# Patient Record
Sex: Male | Born: 1940 | Race: Black or African American | Hispanic: No | Marital: Married | Smoking: Current every day smoker
Health system: Southern US, Community
[De-identification: ages and names within clinical notes are randomized; demographics above are authoritative.]

## PROBLEM LIST (undated history)

## (undated) DIAGNOSIS — I739 Peripheral vascular disease, unspecified: Principal | ICD-10-CM

## (undated) DIAGNOSIS — M5090 Cervical disc disorder, unspecified, unspecified cervical region: Secondary | ICD-10-CM

## (undated) DIAGNOSIS — J449 Chronic obstructive pulmonary disease, unspecified: Secondary | ICD-10-CM

## (undated) DIAGNOSIS — N529 Male erectile dysfunction, unspecified: Secondary | ICD-10-CM

## (undated) DIAGNOSIS — F1910 Other psychoactive substance abuse, uncomplicated: Secondary | ICD-10-CM

## (undated) DIAGNOSIS — K279 Peptic ulcer, site unspecified, unspecified as acute or chronic, without hemorrhage or perforation: Secondary | ICD-10-CM

## (undated) DIAGNOSIS — M1990 Unspecified osteoarthritis, unspecified site: Secondary | ICD-10-CM

## (undated) DIAGNOSIS — E785 Hyperlipidemia, unspecified: Secondary | ICD-10-CM

## (undated) HISTORY — DX: Chronic obstructive pulmonary disease, unspecified: J44.9

## (undated) HISTORY — DX: Peptic ulcer, site unspecified, unspecified as acute or chronic, without hemorrhage or perforation: K27.9

## (undated) HISTORY — DX: Cervical disc disorder, unspecified, unspecified cervical region: M50.90

## (undated) HISTORY — DX: Other psychoactive substance abuse, uncomplicated: F19.10

## (undated) HISTORY — DX: Male erectile dysfunction, unspecified: N52.9

## (undated) HISTORY — DX: Hyperlipidemia, unspecified: E78.5

## (undated) HISTORY — DX: Unspecified osteoarthritis, unspecified site: M19.90

## (undated) HISTORY — PX: HARVEST BONE MARROW: SUR649

## (undated) HISTORY — DX: Peripheral vascular disease, unspecified: I73.9

## (undated) HISTORY — PX: ILIAC ARTERY STENT: SHX1786

---

## 1972-09-12 HISTORY — PX: STOMACH SURGERY: SHX791

## 1994-09-12 HISTORY — PX: CERVICAL DISC SURGERY: SHX588

## 1998-06-25 ENCOUNTER — Ambulatory Visit (HOSPITAL_COMMUNITY): Admission: RE | Admit: 1998-06-25 | Discharge: 1998-06-25 | Payer: Self-pay | Admitting: *Deleted

## 1998-08-10 ENCOUNTER — Ambulatory Visit (HOSPITAL_COMMUNITY): Admission: RE | Admit: 1998-08-10 | Discharge: 1998-08-10 | Payer: Self-pay | Admitting: *Deleted

## 2006-04-10 ENCOUNTER — Ambulatory Visit: Payer: Self-pay | Admitting: Internal Medicine

## 2006-05-01 ENCOUNTER — Ambulatory Visit: Payer: Self-pay | Admitting: Gastroenterology

## 2006-05-10 ENCOUNTER — Ambulatory Visit: Payer: Self-pay | Admitting: Gastroenterology

## 2006-05-10 ENCOUNTER — Encounter: Payer: Self-pay | Admitting: Gastroenterology

## 2006-05-10 LAB — HM COLONOSCOPY

## 2007-01-16 ENCOUNTER — Ambulatory Visit: Payer: Self-pay | Admitting: Internal Medicine

## 2007-01-16 DIAGNOSIS — R42 Dizziness and giddiness: Secondary | ICD-10-CM

## 2007-04-11 DIAGNOSIS — M5030 Other cervical disc degeneration, unspecified cervical region: Secondary | ICD-10-CM

## 2007-04-11 DIAGNOSIS — Z860100 Personal history of colon polyps, unspecified: Secondary | ICD-10-CM | POA: Insufficient documentation

## 2007-04-11 DIAGNOSIS — J449 Chronic obstructive pulmonary disease, unspecified: Secondary | ICD-10-CM

## 2007-04-11 DIAGNOSIS — K279 Peptic ulcer, site unspecified, unspecified as acute or chronic, without hemorrhage or perforation: Secondary | ICD-10-CM | POA: Insufficient documentation

## 2007-04-24 DIAGNOSIS — N529 Male erectile dysfunction, unspecified: Secondary | ICD-10-CM

## 2007-06-14 ENCOUNTER — Ambulatory Visit: Payer: Self-pay | Admitting: Internal Medicine

## 2007-06-14 LAB — CONVERTED CEMR LAB: Glucose, Bld: 99 mg/dL (ref 70–99)

## 2011-05-13 ENCOUNTER — Encounter: Payer: Self-pay | Admitting: Gastroenterology

## 2011-08-11 ENCOUNTER — Telehealth: Payer: Self-pay | Admitting: *Deleted

## 2011-08-11 NOTE — Telephone Encounter (Signed)
Pt calling to have colonoscopy scheduled

## 2011-08-11 NOTE — Telephone Encounter (Signed)
Please check with GI to see if he is due --he does have a history of polyps  I haven't seen him in quite a while, should set up physical with me as well (or Medicare wellness visit if he has regular Medicare)

## 2011-10-21 ENCOUNTER — Ambulatory Visit (AMBULATORY_SURGERY_CENTER): Payer: Medicare Other | Admitting: *Deleted

## 2011-10-21 VITALS — Ht 68.5 in | Wt 170.0 lb

## 2011-10-21 DIAGNOSIS — Z1211 Encounter for screening for malignant neoplasm of colon: Secondary | ICD-10-CM

## 2011-10-21 MED ORDER — PEG-KCL-NACL-NASULF-NA ASC-C 100 G PO SOLR: ORAL | Status: DC

## 2011-10-21 NOTE — Progress Notes (Signed)
Pt. States he doesn't read much at all.  Consent and info verbally read and explained to pt.

## 2011-11-04 ENCOUNTER — Other Ambulatory Visit: Payer: Self-pay | Admitting: Gastroenterology

## 2011-11-11 ENCOUNTER — Encounter: Payer: Self-pay | Admitting: Gastroenterology

## 2011-11-11 ENCOUNTER — Ambulatory Visit (AMBULATORY_SURGERY_CENTER): Payer: Medicare Other | Admitting: Gastroenterology

## 2011-11-11 VITALS — BP 168/99 | HR 81 | Temp 96.1°F | Resp 18 | Ht 68.0 in | Wt 170.0 lb

## 2011-11-11 DIAGNOSIS — Z86010 Personal history of colonic polyps: Secondary | ICD-10-CM

## 2011-11-11 DIAGNOSIS — Z1211 Encounter for screening for malignant neoplasm of colon: Secondary | ICD-10-CM | POA: Diagnosis not present

## 2011-11-11 DIAGNOSIS — K635 Polyp of colon: Secondary | ICD-10-CM

## 2011-11-11 DIAGNOSIS — D126 Benign neoplasm of colon, unspecified: Secondary | ICD-10-CM | POA: Diagnosis not present

## 2011-11-11 MED ORDER — SODIUM CHLORIDE 0.9 % IV SOLN: 500.0000 mL | INTRAVENOUS | Status: DC

## 2011-11-11 NOTE — Progress Notes (Signed)
Patient did not experience any of the following events: a burn prior to discharge; a fall within the facility; wrong site/side/patient/procedure/implant event; or a hospital transfer or hospital admission upon discharge from the facility. (G8907) Patient did not have preoperative order for IV antibiotic SSI prophylaxis. (G8918)  

## 2011-11-11 NOTE — Op Note (Signed)
[facility] [address] [place], [place]  [address]  COLONOSCOPY PROCEDURE REPORT  PATIENT:  [name], Sean  MR#:  [id number] BIRTHDATE:  1941/09/05, 70 yrs. old  GENDER:  male ENDOSCOPIST:  Rachael Fee, MD PROCEDURE DATE:  11/11/2011 PROCEDURE:  Colonoscopy with snare polypectomy ASA CLASS:  Class II INDICATIONS:  adenomatous polyp removed 2007 MEDICATIONS:   Fentanyl 50 mcg IV, These medications were titrated to patient response per physician's verbal order, Versed 5 mg IV  DESCRIPTION OF PROCEDURE:   After the risks benefits and alternatives of the procedure were thoroughly explained, informed consent was obtained.  Digital rectal exam was performed and revealed no rectal masses.   The LB [id number] endoscope was introduced through the anus and advanced to the cecum, which was identified by both the appendix and ileocecal valve, without limitations.  The quality of the prep was good..  The instrument was then slowly withdrawn as the colon was fully examined. <<PROCEDUREIMAGES>> FINDINGS:  Three small sessile polyps were found, removed with cold snare and all were sent to pathology. These ranged in size from 2mm to 4mm, located in transverse and sigmoid segments (see image3 and image4).  This was otherwise a normal examination of the colon (see image5, image2, and image1).   Retroflexed views in the rectum revealed no abnormalities. COMPLICATIONS:  None  ENDOSCOPIC IMPRESSION: 1) Three small polyps, all were removed and all were sent to pathology 2) Otherwise normal examination  RECOMMENDATIONS: 1) Given your personal history of adenomatous (pre-cancerous) polyps, you will need a repeat colonoscopy in 5 years even if the polyps removed today are not pre-cancerous.  ______________________________ Rachael Fee, MD  n. eSIGNED:   Rachael Fee at 11/11/2011 02:52 PM  Pamala Duffel, [phone]

## 2011-11-11 NOTE — Patient Instructions (Addendum)
One of your biggest health concerns is your smoking.  This increases your risk for most cancers and serious cardiovascular diseases such as strokes, heart attacks.  You should try your best to stop.  If you need assistance, please contact your PCP or Smoking Cessation Class at [facility] ([phone]) or [facility] (1-800-QUIT-NOW). Discharge instructions given with verbal understanding. Handout on polyps given. Resume previous medications. YOU HAD AN ENDOSCOPIC PROCEDURE TODAY AT THE [facility]: Refer to the procedure report that was given to you for any specific questions about what was found during the examination.  If the procedure report does not answer your questions, please call your gastroenterologist to clarify.  If you requested that your care partner not be given the details of your procedure findings, then the procedure report has been included in a sealed envelope for you to review at your convenience later.  YOU SHOULD EXPECT: Some feelings of bloating in the abdomen. Passage of more gas than usual.  Walking can help get rid of the air that was put into your GI tract during the procedure and reduce the bloating. If you had a lower endoscopy (such as a colonoscopy or flexible sigmoidoscopy) you may notice spotting of blood in your stool or on the toilet paper. If you underwent a bowel prep for your procedure, then you may not have a normal bowel movement for a few days.  DIET: Your first meal following the procedure should be a light meal and then it is ok to progress to your normal diet.  A half-sandwich or bowl of soup is an example of a good first meal.  Heavy or fried foods are harder to digest and may make you feel nauseous or bloated.  Likewise meals heavy in dairy and vegetables can cause extra gas to form and this can also increase the bloating.  Drink plenty of fluids but you should avoid alcoholic beverages for 24 hours.  ACTIVITY: Your care partner  should take you home directly after the procedure.  You should plan to take it easy, moving slowly for the rest of the day.  You can resume normal activity the day after the procedure however you should NOT DRIVE or use heavy machinery for 24 hours (because of the sedation medicines used during the test).    SYMPTOMS TO REPORT IMMEDIATELY: A gastroenterologist can be reached at any hour.  During normal business hours, 8:30 AM to 5:00 PM Monday through Friday, call [phone].  After hours and on weekends, please call the GI answering service at [phone] who will take a message and have the physician on call contact you.   Following lower endoscopy (colonoscopy or flexible sigmoidoscopy):  Excessive amounts of blood in the stool  Significant tenderness or worsening of abdominal pains  Swelling of the abdomen that is new, acute  Fever of 100F or higher  FOLLOW UP: If any biopsies were taken you will be contacted by phone or by letter within the next 1-3 weeks.  Call your gastroenterologist if you have not heard about the biopsies in 3 weeks.  Our staff will call the home number listed on your records the next business day following your procedure to check on you and address any questions or concerns that you may have at that time regarding the information given to you following your procedure. This is a courtesy call and so if there is no answer at the home number and we have not heard from you through the   emergency physician on call, we will assume that you have returned to your regular daily activities without incident.  SIGNATURES/CONFIDENTIALITY: You and/or your care partner have signed paperwork which will be entered into your electronic medical record.  These signatures attest to the fact that that the information above on your After Visit Summary has been reviewed and is understood.  Full responsibility of the confidentiality of this discharge information lies with you and/or your  care-partner. 

## 2011-11-14 ENCOUNTER — Telehealth: Payer: Self-pay

## 2011-11-14 NOTE — Telephone Encounter (Signed)
  Follow up Call-  Call back number 11/11/2011  Post procedure Call Back phone  # [phone]  Permission to leave phone message Yes     Patient questions:  Do you have a fever, pain , or abdominal swelling? no Pain Score  0 *  Have you tolerated food without any problems? yes  Have you been able to return to your normal activities? yes  Do you have any questions about your discharge instructions: Diet   no Medications  no Follow up visit  no  Do you have questions or concerns about your Care? no  Actions: * If pain score is 4 or above: No action needed, pain <4.

## 2011-11-17 ENCOUNTER — Encounter: Payer: Self-pay | Admitting: Gastroenterology

## 2011-11-28 ENCOUNTER — Encounter: Payer: Self-pay | Admitting: Internal Medicine

## 2011-11-28 ENCOUNTER — Ambulatory Visit (INDEPENDENT_AMBULATORY_CARE_PROVIDER_SITE_OTHER): Payer: Medicare Other | Admitting: Internal Medicine

## 2011-11-28 VITALS — BP 152/70 | HR 96 | Temp 98.0°F | Ht 68.0 in | Wt 172.0 lb

## 2011-11-28 DIAGNOSIS — I10 Essential (primary) hypertension: Secondary | ICD-10-CM | POA: Diagnosis not present

## 2011-11-28 DIAGNOSIS — Z0000 Encounter for general adult medical examination without abnormal findings: Secondary | ICD-10-CM

## 2011-11-28 DIAGNOSIS — R030 Elevated blood-pressure reading, without diagnosis of hypertension: Secondary | ICD-10-CM | POA: Insufficient documentation

## 2011-11-28 DIAGNOSIS — M159 Polyosteoarthritis, unspecified: Secondary | ICD-10-CM | POA: Insufficient documentation

## 2011-11-28 DIAGNOSIS — N529 Male erectile dysfunction, unspecified: Secondary | ICD-10-CM

## 2011-11-28 NOTE — Assessment & Plan Note (Signed)
I have personally reviewed the Medicare Annual Wellness questionnaire and have noted 1. The patient's medical and social history 2. Their use of alcohol, tobacco or illicit drugs 3. Their current medications and supplements 4. The patient's functional ability including ADL's, fall risks, home safety risks and hearing or visual             impairment. 5. Diet and physical activities 6. Evidence for depression or mood disorders  The patients weight, height, BMI and visual acuity have been recorded in the chart I have made referrals, counseling and provided education to the patient based review of the above and I have provided the pt with a written personalized care plan for preventive services.  I have provided you with a copy of your personalized plan for preventive services. Please take the time to review along with your updated medication list.  No PSA after discussion Doesn't want imms Not interested in stopping smoking

## 2011-11-28 NOTE — Progress Notes (Signed)
Subjective:    Patient ID: Sean [name], male    DOB: December 27, 1940, 71 y.o.   MRN: [id number]  HPI Here for Medicare Wellness exam Wife made him come here  No depression or anhedonia No falls or instability Follows some exercise programs on TV Totally retired now Reviewed advanced directives Did not want any vaccines Just had colonoscopy Doesn't want PSA done after discussion  Still has occ problems with neck pain Left arm will go numb at night at times No meds other than glucosamine chondroitin for general joint pain  Ongoing ED Tried cialis-- too expensive  Not interested in quitting smoking No sig cough  Breathing has been okay  Has right hip pain ---after walking a little ways No pain in feet Some knee pain as well  No current outpatient prescriptions on file prior to visit.    No Known Allergies  Past Medical History  Diagnosis Date  . Arthritis   . Substance abuse   . Ulcer   . COPD (chronic obstructive pulmonary disease)   . ED (erectile dysfunction)   . Cervical disc disease     Past Surgical History  Procedure Date  . Cervical disc surgery 1996  . Stomach surgery 1974    gastric ulcer    Family History  Problem Relation Age of Onset  . Arthritis Sister   . Hypertension Neg Hx   . Cancer Neg Hx     colon cancer    History   Social History  . Marital Status: Married    Spouse Name: N/A    Number of Children: 7  . Years of Education: N/A   Occupational History  . retired Music therapist     Social History Main Topics  . Smoking status: Current Everyday Smoker -- 0.8 packs/day    Types: Cigarettes  . Smokeless tobacco: Never Used  . Alcohol Use: Yes     rarely  . Drug Use: 7 per week    Special: Marijuana  . Sexually Active: Not on file   Other Topics Concern  . Not on file   Social History Narrative   Tries to do exercises regularly (TV shows)No living willRequests that wife make medical decisions for him if he is unableWould  accept CPR. Would accept mechanical ventilation. Would accept feeding tube   Review of Systems Sleeps well Appetite is stable Weight is stable No skin problems Bowels fine No trouble voiding---no nocturia. Does have mild urgency    Objective:   Physical Exam  Constitutional: He is oriented to person, place, and time. He appears well-developed and well-nourished. No distress.  Neck: Normal range of motion. Neck supple. No thyromegaly present.  Cardiovascular: Normal rate, regular rhythm and normal heart sounds.  Exam reveals no gallop.   No murmur heard.      Very faint pedal pulses  Pulmonary/Chest: Effort normal and breath sounds normal. No respiratory distress. He has no wheezes. He has no rales.  Abdominal: Soft. There is no tenderness.  Musculoskeletal: He exhibits no edema and no tenderness.       Slight decrease in internal rotation of right hip  Lymphadenopathy:    He has no cervical adenopathy.  Neurological: He is alert and oriented to person, place, and time.       President-- "Obama, Bush, Clinton, Bush" [id number]-w Recall  3/3  Psychiatric: He has a normal mood and affect. His behavior is normal. Judgment and thought content normal.  Assessment & Plan:

## 2011-11-28 NOTE — Assessment & Plan Note (Signed)
cilais works but too expensive

## 2011-11-28 NOTE — Assessment & Plan Note (Signed)
Mostly right hip and knees Considered vascular etiology for hip pain---not likely. If his symptoms worsen, will consider checking ABIs though Can try tylenol

## 2011-11-28 NOTE — Patient Instructions (Signed)
Please call for further testing if you have more trouble walking with hip or leg pain You can try acetaminophen 650mg  up to three times a day for joint pains

## 2011-11-28 NOTE — Assessment & Plan Note (Signed)
BP Readings from Last 3 Encounters:  11/28/11 152/70  11/11/11 168/99  06/14/07 138/78   Probably white coat component Wouldn't take meds anyway Will just monitor

## 2011-11-29 LAB — BASIC METABOLIC PANEL
BUN: 11 mg/dL (ref 6–23)
CO2: 27 mEq/L (ref 19–32)
Chloride: 102 mEq/L (ref 96–112)
GFR: 83.5 mL/min (ref >60.00)
Glucose, Bld: 88 mg/dL (ref 70–99)
Potassium: 4.2 mEq/L (ref 3.5–5.1)
Sodium: 139 mEq/L (ref 135–145)

## 2011-11-29 LAB — LDL CHOLESTEROL, DIRECT: Direct LDL: 159.5 mg/dL

## 2011-11-29 LAB — CBC WITH DIFFERENTIAL/PLATELET
Basophils Absolute: 0.1 10*3/uL (ref 0.0–0.1)
Eosinophils Relative: 2.6 % (ref 0.0–5.0)
HCT: 40.6 % (ref 39.0–52.0)
Hemoglobin: 13 g/dL (ref 13.0–17.0)
Lymphs Abs: 1.7 10*3/uL (ref 0.7–4.0)
MCV: 85.2 fl (ref 78.0–100.0)
Monocytes Absolute: 0.5 10*3/uL (ref 0.1–1.0)
Monocytes Relative: 7.2 % (ref 3.0–12.0)
Neutro Abs: 4.2 10*3/uL (ref 1.4–7.7)
RDW: 15.4 % — ABNORMAL HIGH (ref 11.5–14.6)

## 2011-11-29 LAB — LIPID PANEL: HDL: 37 mg/dL — ABNORMAL LOW (ref >39.00)

## 2011-11-29 LAB — HEPATIC FUNCTION PANEL
ALT: 14 U/L (ref 0–53)
AST: 20 U/L (ref 0–37)
Albumin: 4 g/dL (ref 3.5–5.2)

## 2011-11-30 ENCOUNTER — Encounter: Payer: Self-pay | Admitting: *Deleted

## 2013-01-14 ENCOUNTER — Ambulatory Visit: Payer: Medicare Other | Admitting: Internal Medicine

## 2013-01-16 ENCOUNTER — Ambulatory Visit (INDEPENDENT_AMBULATORY_CARE_PROVIDER_SITE_OTHER): Payer: Medicare Other | Admitting: Internal Medicine

## 2013-01-16 ENCOUNTER — Encounter: Payer: Self-pay | Admitting: Internal Medicine

## 2013-01-16 VITALS — BP 148/80 | HR 88 | Temp 97.8°F | Wt 169.0 lb

## 2013-01-16 DIAGNOSIS — Z125 Encounter for screening for malignant neoplasm of prostate: Secondary | ICD-10-CM | POA: Diagnosis not present

## 2013-01-16 DIAGNOSIS — J449 Chronic obstructive pulmonary disease, unspecified: Secondary | ICD-10-CM

## 2013-01-16 DIAGNOSIS — R030 Elevated blood-pressure reading, without diagnosis of hypertension: Secondary | ICD-10-CM

## 2013-01-16 DIAGNOSIS — E785 Hyperlipidemia, unspecified: Secondary | ICD-10-CM | POA: Diagnosis not present

## 2013-01-16 DIAGNOSIS — J4489 Other specified chronic obstructive pulmonary disease: Secondary | ICD-10-CM

## 2013-01-16 DIAGNOSIS — R0602 Shortness of breath: Secondary | ICD-10-CM

## 2013-01-16 DIAGNOSIS — I739 Peripheral vascular disease, unspecified: Secondary | ICD-10-CM

## 2013-01-16 LAB — CBC WITH DIFFERENTIAL/PLATELET
Basophils Absolute: 0.1 10*3/uL (ref 0.0–0.1)
Eosinophils Absolute: 0.2 10*3/uL (ref 0.0–0.7)
Lymphocytes Relative: 33.5 % (ref 12.0–46.0)
MCHC: 33 g/dL (ref 30.0–36.0)
Monocytes Relative: 8.8 % (ref 3.0–12.0)
Neutrophils Relative %: 54.3 % (ref 43.0–77.0)
RDW: 17.3 % — ABNORMAL HIGH (ref 11.5–14.6)

## 2013-01-16 LAB — BASIC METABOLIC PANEL
CO2: 26 mEq/L (ref 19–32)
Calcium: 9.3 mg/dL (ref 8.4–10.5)
Creatinine, Ser: 1.4 mg/dL (ref 0.4–1.5)
GFR: 64.33 mL/min (ref >60.00)
Glucose, Bld: 106 mg/dL — ABNORMAL HIGH (ref 70–99)

## 2013-01-16 LAB — LIPID PANEL: Cholesterol: 261 mg/dL — ABNORMAL HIGH (ref 0–200)

## 2013-01-16 LAB — HEPATIC FUNCTION PANEL
Alkaline Phosphatase: 91 U/L (ref 39–117)
Bilirubin, Direct: 0 mg/dL (ref 0.0–0.3)
Total Bilirubin: 0.7 mg/dL (ref 0.3–1.2)
Total Protein: 7.6 g/dL (ref 6.0–8.3)

## 2013-01-16 LAB — TSH: TSH: 1.09 u[IU]/mL (ref 0.35–5.50)

## 2013-01-16 NOTE — Assessment & Plan Note (Signed)
Unwilling to stop cigarettes Symptoms don't really warrant Rx now (he doesn't want anything)

## 2013-01-16 NOTE — Assessment & Plan Note (Signed)
BP Readings from Last 3 Encounters:  01/16/13 148/80  11/28/11 152/70  11/11/11 168/99   With new guidelines, and since he won't take meds, will just observe

## 2013-01-16 NOTE — Assessment & Plan Note (Signed)
Has relatively stable DOE Doesn't cough or wheeze Likely related to COPD----he is not willing to stop smoking but discussed cutting down Could have CAD but no specific indications of this

## 2013-01-16 NOTE — Assessment & Plan Note (Signed)
If vascular disease confirmed, should at least offer statin

## 2013-01-16 NOTE — Assessment & Plan Note (Signed)
Hip pain seems to be claudication, not from hip Will check ABIs

## 2013-01-16 NOTE — Progress Notes (Signed)
  Subjective:    Patient ID: Sean [name], male    DOB: [birth year], 72 y.o.   MRN: [id number]  HPI Here with wife---only here due to her concerns  Ongoing hip pain---right Has to sit regularly when he tries to walk Glucosamine/chondroitin and joint juice----not particularly helpful No OTC analgesics Has tried elliptical at home--- does 5-10 minutes at a time  Gets SOB easily too Has to rest quickly when doing work Some nighttime cough---dry No wheezing No chest pain No palpitations  Checked BP at [facility] 189/something That prompted the visit  Current Outpatient Prescriptions on File Prior to Visit  Medication Sig Dispense Refill  . glucosamine-chondroitin 500-400 MG tablet Take 1 tablet by mouth daily.       No current facility-administered medications on file prior to visit.    No Known Allergies  Past Medical History  Diagnosis Date  . Arthritis   . Substance abuse   . Ulcer   . COPD (chronic obstructive pulmonary disease)   . ED (erectile dysfunction)   . Cervical disc disease     Past Surgical History  Procedure Laterality Date  . Cervical disc surgery  1996  . Stomach surgery  1974    gastric ulcer    Family History  Problem Relation Age of Onset  . Arthritis Sister   . Hypertension Neg Hx   . Cancer Neg Hx     colon cancer    History   Social History  . Marital Status: Married    Spouse Name: N/A    Number of Children: 7  . Years of Education: N/A   Occupational History  . retired Music therapist     Social History Main Topics  . Smoking status: Current Every Day Smoker -- 0.80 packs/day    Types: Cigarettes  . Smokeless tobacco: Never Used  . Alcohol Use: Yes     Comment: rarely  . Drug Use: 7.00 per week    Special: Marijuana  . Sexually Active: Not on file   Other Topics Concern  . Not on file   Social History Narrative   Tries to do exercises regularly (TV shows)   No living will   Requests that wife make medical decisions  for him if he is unable   Would accept CPR. Would accept mechanical ventilation. Would accept feeding tube   Review of Systems Appetite is fine Sleeps well No mood problems but tends to want to just stay home Weight is down 3#    Objective:   Physical Exam  Constitutional: He appears well-developed and well-nourished. No distress.  Neck: Normal range of motion. Neck supple. No thyromegaly present.  Cardiovascular: Normal rate, regular rhythm and normal heart sounds.  Exam reveals no gallop.   No murmur heard. No pedal pulses  Pulmonary/Chest:  Slightly decreased breath sounds but clear  Abdominal: Soft. He exhibits no mass. There is no tenderness.  Musculoskeletal: He exhibits no edema and no tenderness.  Normal internal and external rotation of both hips  Lymphadenopathy:    He has no cervical adenopathy.  Psychiatric: He has a normal mood and affect. His behavior is normal.          Assessment & Plan:

## 2013-01-17 ENCOUNTER — Encounter: Payer: Self-pay | Admitting: *Deleted

## 2013-01-18 ENCOUNTER — Encounter (INDEPENDENT_AMBULATORY_CARE_PROVIDER_SITE_OTHER): Payer: Medicare Other

## 2013-01-18 ENCOUNTER — Telehealth: Payer: Self-pay | Admitting: *Deleted

## 2013-01-18 DIAGNOSIS — I739 Peripheral vascular disease, unspecified: Secondary | ICD-10-CM

## 2013-01-18 DIAGNOSIS — I70219 Atherosclerosis of native arteries of extremities with intermittent claudication, unspecified extremity: Secondary | ICD-10-CM | POA: Diagnosis not present

## 2013-01-18 NOTE — Telephone Encounter (Signed)
CALL REPORT!  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 Per Sean [name], they will schedule appt with peripheral vascular consult for 01/29/13 @ 9am with Dr. Kirke Corin, they wanted to make the appt today because if not he would have to wait longer. If pt or Dr.Letvak would like to change the appt please call the office.  ( the report should be read by Monday)

## 2013-01-18 NOTE — Telephone Encounter (Signed)
This has been going on for quite some time I think the appt later this month should be fine

## 2013-01-29 ENCOUNTER — Ambulatory Visit: Payer: Medicare Other | Admitting: Cardiovascular Disease

## 2013-02-05 ENCOUNTER — Encounter: Payer: Self-pay | Admitting: Cardiology

## 2013-02-05 ENCOUNTER — Encounter: Payer: Self-pay | Admitting: Cardiovascular Disease

## 2013-02-05 ENCOUNTER — Ambulatory Visit (INDEPENDENT_AMBULATORY_CARE_PROVIDER_SITE_OTHER): Payer: Medicare Other | Admitting: Cardiovascular Disease

## 2013-02-05 VITALS — BP 183/79 | HR 80 | Wt 171.0 lb

## 2013-02-05 DIAGNOSIS — F17200 Nicotine dependence, unspecified, uncomplicated: Secondary | ICD-10-CM

## 2013-02-05 DIAGNOSIS — F17210 Nicotine dependence, cigarettes, uncomplicated: Secondary | ICD-10-CM | POA: Insufficient documentation

## 2013-02-05 DIAGNOSIS — R0602 Shortness of breath: Secondary | ICD-10-CM | POA: Diagnosis not present

## 2013-02-05 DIAGNOSIS — Z01812 Encounter for preprocedural laboratory examination: Secondary | ICD-10-CM

## 2013-02-05 DIAGNOSIS — I739 Peripheral vascular disease, unspecified: Secondary | ICD-10-CM

## 2013-02-05 DIAGNOSIS — Z720 Tobacco use: Secondary | ICD-10-CM

## 2013-02-05 DIAGNOSIS — I10 Essential (primary) hypertension: Secondary | ICD-10-CM

## 2013-02-05 DIAGNOSIS — E785 Hyperlipidemia, unspecified: Secondary | ICD-10-CM | POA: Diagnosis not present

## 2013-02-05 HISTORY — DX: Peripheral vascular disease, unspecified: I73.9

## 2013-02-05 LAB — BASIC METABOLIC PANEL
BUN: 13 mg/dL (ref 6–23)
CO2: 27 mEq/L (ref 19–32)
Chloride: 102 mEq/L (ref 96–112)
GFR: 73.31 mL/min (ref >60.00)
Glucose, Bld: 98 mg/dL (ref 70–99)
Potassium: 4.1 mEq/L (ref 3.5–5.1)
Sodium: 136 mEq/L (ref 135–145)

## 2013-02-05 LAB — CBC WITH DIFFERENTIAL/PLATELET
Basophils Absolute: 0.1 10*3/uL (ref 0.0–0.1)
HCT: 40.6 % (ref 39.0–52.0)
Hemoglobin: 13.4 g/dL (ref 13.0–17.0)
Lymphs Abs: 2.2 10*3/uL (ref 0.7–4.0)
MCV: 81.7 fl (ref 78.0–100.0)
Monocytes Absolute: 0.6 10*3/uL (ref 0.1–1.0)
Monocytes Relative: 8.6 % (ref 3.0–12.0)
Neutro Abs: 4.2 10*3/uL (ref 1.4–7.7)
Platelets: 282 10*3/uL (ref 150.0–400.0)
RDW: 16.7 % — ABNORMAL HIGH (ref 11.5–14.6)

## 2013-02-05 MED ORDER — ATORVASTATIN CALCIUM 20 MG PO TABS: 20.0000 mg | ORAL_TABLET | Freq: Every day | ORAL | Status: DC

## 2013-02-05 MED ORDER — AMLODIPINE BESYLATE 5 MG PO TABS: 5.0000 mg | ORAL_TABLET | Freq: Every day | ORAL | Status: DC

## 2013-02-05 NOTE — Progress Notes (Signed)
HPI  This is a 72 year old African American male who was referred by Dr. Alphonsus Sias for evaluation and management of peripheral arterial disease. He has prolonged history of tobacco use but no known coronary artery disease. He also has hypertension and hyperlipidemia. He has been having right hip and buttock discomfort with walking for more than a year and thought it was due to arthritis. He gets significant discomfort after about walking on the 75-100 yards which forces him to stop for about 3 minutes before he can resume. He has cut down on his activities due to that. He has exertional dyspnea without reported chest pain.  No Known Allergies   Current Outpatient Prescriptions on File Prior to Visit  Medication Sig Dispense Refill  . glucosamine-chondroitin 500-400 MG tablet Take 1 tablet by mouth daily.       No current facility-administered medications on file prior to visit.     Past Medical History  Diagnosis Date  . Arthritis   . Substance abuse   . Ulcer   . COPD (chronic obstructive pulmonary disease)   . ED (erectile dysfunction)   . Cervical disc disease   . Hyperlipidemia      Past Surgical History  Procedure Laterality Date  . Cervical disc surgery  1996  . Stomach surgery  1974    gastric ulcer     Family History  Problem Relation Age of Onset  . Arthritis Sister   . Hypertension Neg Hx   . Cancer Neg Hx     colon cancer     History   Social History  . Marital Status: Married    Spouse Name: N/A    Number of Children: 7  . Years of Education: N/A   Occupational History  . retired Music therapist     Social History Main Topics  . Smoking status: Current Every Day Smoker -- 0.80 packs/day    Types: Cigarettes  . Smokeless tobacco: Never Used  . Alcohol Use: Yes     Comment: rarely  . Drug Use: 7.00 per week    Special: Marijuana  . Sexually Active: Not on file   Other Topics Concern  . Not on file   Social History Narrative   Tries to do  exercises regularly (TV shows)   No living will   Requests that wife make medical decisions for him if he is unable   Would accept CPR. Would accept mechanical ventilation. Would accept feeding tube     ROS Constitutional: Negative for fever, chills, diaphoresis, activity change, appetite change and fatigue.  HENT: Negative for hearing loss, nosebleeds, congestion, sore throat, facial swelling, drooling, trouble swallowing, neck pain, voice change, sinus pressure and tinnitus.  Eyes: Negative for photophobia, pain, discharge and visual disturbance.  Respiratory: Negative for apnea, cough, chest tightness and wheezing.  Cardiovascular: Negative for chest pain, palpitations and leg swelling.  Gastrointestinal: Negative for nausea, vomiting, abdominal pain, diarrhea, constipation, blood in stool and abdominal distention.  Genitourinary: Negative for dysuria, urgency, frequency, hematuria and decreased urine volume.  Skin: Negative for color change, pallor, rash and wound.  Neurological: Negative for dizziness, tremors, seizures, syncope, speech difficulty, weakness, light-headedness, numbness and headaches.  Psychiatric/Behavioral: Negative for suicidal ideas, hallucinations, behavioral problems and agitation. The patient is not nervous/anxious.     PHYSICAL EXAM   BP 183/79  Pulse 80  Wt 171 lb (77.565 kg)  BMI 26.01 kg/m2 Constitutional: He is oriented to person, place, and time. He appears well-developed and well-nourished. No  distress.  HENT: No nasal discharge.  Head: Normocephalic and atraumatic.  Eyes: Pupils are equal and round. Right eye exhibits no discharge. Left eye exhibits no discharge.  Neck: Normal range of motion. Neck supple. No JVD present. No thyromegaly present.  Cardiovascular: Normal rate, regular rhythm, normal heart sounds and. Exam reveals no gallop and no friction rub. No murmur heard.  Pulmonary/Chest: Effort normal and breath sounds normal. No stridor. No  respiratory distress. He has no wheezes. He has no rales. He exhibits no tenderness.  Abdominal: Soft. Bowel sounds are normal. He exhibits no distension. There is no tenderness. There is no rebound and no guarding.  Musculoskeletal: Normal range of motion. He exhibits no edema and no tenderness.  Neurological: He is alert and oriented to person, place, and time. Coordination normal.  Skin: Skin is warm and dry. No rash noted. He is not diaphoretic. No erythema. No pallor.  Psychiatric: He has a normal mood and affect. His behavior is normal. Judgment and thought content normal.  Vascular: Radial pulses normal bilaterally. Femoral pulses are diminished on the right side and slightly diminished on the left side. Distal pulses are not palpable.     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   ASSESSMENT AND PLAN

## 2013-02-05 NOTE — Assessment & Plan Note (Signed)
I had a prolonged discussion with him about the importance of smoking cessation. 

## 2013-02-05 NOTE — Addendum Note (Signed)
Addended by: Lorine Bears A on: 02/05/2013 10:03 AM   Modules accepted: Orders

## 2013-02-05 NOTE — Assessment & Plan Note (Signed)
The patient has severe lifestyle limiting claudication affecting the right hip and buttock area. ABI was moderately reduced bilaterally and worse on the right side at 0.42 on the right and 0.5 on the left. There was evidence of severe stenosis involving the right external iliac artery which is likely the culprit. The left SFA was also noted to be occluded proximally with reconstitution in the midsegment. He is not having claudication on the left side likely due to developing symptoms of the right side before the threshold for left leg claudication. Given the severity of his symptoms, I recommend proceeding with abdominal aortogram, lower extremity runoff and possible angioplasty.

## 2013-02-05 NOTE — Assessment & Plan Note (Signed)
Due to his vascular disease, I recommend that also we treat this. I will start amlodipine 5 mg once daily to make sure his blood pressure is reasonably controlled before the procedure.

## 2013-02-05 NOTE — Assessment & Plan Note (Signed)
It is possible that this is due to COPD and prolonged history of smoking. However, he has significant risk factors for coronary artery disease with an abnormal baseline EKG. He will need  some form of stress testing at some point.

## 2013-02-05 NOTE — Assessment & Plan Note (Signed)
Lab Results  Component Value Date   CHOL 261* 01/16/2013   HDL 32.60* 01/16/2013   LDLDIRECT 181.8 01/16/2013   TRIG 189.0* 01/16/2013   CHOLHDL 8 01/16/2013   Given that he now has established atherosclerosis, I recommend aggressive treatment of his hyperlipidemia. Due to that, I will go ahead and start atorvastatin 20 mg daily.

## 2013-02-05 NOTE — Patient Instructions (Addendum)
Your physician recommends that you have lab today: BMET, CBC, PTINR  Your physician has recommended you make the following change in your medication:   1. Start Amlodipine 5 mg Daily 2. Start Atorvastatin 20 mg at Bedtime  Your physician has recommended that you have a Abdominal Aortogram with LE run-off on 02/13/2013 @ 9:30 am. After this is performed, and if a blockage is found, an angioplasty or stenting procedure may be necessary.    Hypertension Hypertension is another name for high blood pressure. High blood pressure may mean that your heart needs to work harder to pump blood. Blood pressure consists of two numbers, which includes a higher number over a lower number (example: 110/72). HOME CARE   Make lifestyle changes as told by your doctor. This may include weight loss and exercise.  Take your blood pressure medicine every day.  Limit how much salt you use.  Stop smoking if you smoke.  Do not use drugs.  Talk to your doctor if you are using decongestants or birth control pills. These medicines might make blood pressure higher.  Females should not drink more than 1 alcoholic drink per day. Males should not drink more than 2 alcoholic drinks per day.  See your doctor as told. GET HELP RIGHT AWAY IF:   You have a blood pressure reading with a top number of 180 or higher.  You get a very bad headache.  You get blurred or changing vision.  You feel confused.  You feel weak, numb, or faint.  You get chest or belly (abdominal) pain.  You throw up (vomit).  You cannot breathe very well. MAKE SURE YOU:   Understand these instructions.  Will watch your condition.  Will get help right away if you are not doing well or get worse. Document Released: 02/15/2008 Document Revised: 11/21/2011 Document Reviewed: 02/15/2008 [facility] Patient Information 2014 [place], [place].  Cholesterol Cholesterol is a type of fat. Your body needs a small amount of cholesterol, but too  much can cause health problems. Certain problems include heart attacks, strokes, and not enough blood flow to your heart, brain, kidneys, or feet. You get cholesterol in 2 ways:  Naturally.  By eating certain foods. HOME CARE  Eat a low-fat diet:  Eat less eggs, whole dairy products (whole milk, cheese, and butter), fatty meats, and fried foods.  Eat more fruits, vegetables, whole-wheat breads, lean chicken, and fish.  Follow your exercise program as told by your doctor.  Keep your weight at a healthy level. Talk to your doctor about what is right for you.  Only take medicine as told by your doctor.  Get your cholesterol checked once a year or as told by your doctor. MAKE SURE YOU:  Understand these instructions.  Will watch your condition.  Will get help right away if you are not doing well or get worse. Document Released: 11/25/2008 Document Revised: 11/21/2011 Document Reviewed: 11/25/2008 [facility] Patient Information 2014 [place], [place].

## 2013-02-06 ENCOUNTER — Encounter (HOSPITAL_COMMUNITY): Payer: Self-pay | Admitting: Pharmacy Technician

## 2013-02-13 ENCOUNTER — Encounter (HOSPITAL_COMMUNITY)
Admission: RE | Disposition: A | Discharge status: Home / Self Care | Payer: Self-pay | Source: Ambulatory Visit | Attending: Cardiovascular Disease

## 2013-02-13 ENCOUNTER — Ambulatory Visit (HOSPITAL_COMMUNITY)
Admission: RE | Admit: 2013-02-13 | Discharge: 2013-02-13 | Disposition: A | Discharge status: Home / Self Care | Payer: Medicare Other | Source: Ambulatory Visit | Attending: Cardiovascular Disease | Admitting: Cardiovascular Disease

## 2013-02-13 ENCOUNTER — Other Ambulatory Visit: Payer: Self-pay | Admitting: Cardiovascular Disease

## 2013-02-13 ENCOUNTER — Other Ambulatory Visit: Payer: Self-pay

## 2013-02-13 DIAGNOSIS — F17200 Nicotine dependence, unspecified, uncomplicated: Secondary | ICD-10-CM | POA: Insufficient documentation

## 2013-02-13 DIAGNOSIS — I739 Peripheral vascular disease, unspecified: Secondary | ICD-10-CM

## 2013-02-13 DIAGNOSIS — J4489 Other specified chronic obstructive pulmonary disease: Secondary | ICD-10-CM | POA: Insufficient documentation

## 2013-02-13 DIAGNOSIS — E785 Hyperlipidemia, unspecified: Secondary | ICD-10-CM | POA: Insufficient documentation

## 2013-02-13 DIAGNOSIS — I708 Atherosclerosis of other arteries: Secondary | ICD-10-CM | POA: Insufficient documentation

## 2013-02-13 DIAGNOSIS — I4460 Unspecified fascicular block: Secondary | ICD-10-CM | POA: Insufficient documentation

## 2013-02-13 DIAGNOSIS — M5090 Cervical disc disorder, unspecified, unspecified cervical region: Secondary | ICD-10-CM | POA: Insufficient documentation

## 2013-02-13 DIAGNOSIS — F1210 Cannabis abuse, uncomplicated: Secondary | ICD-10-CM | POA: Insufficient documentation

## 2013-02-13 DIAGNOSIS — N529 Male erectile dysfunction, unspecified: Secondary | ICD-10-CM | POA: Insufficient documentation

## 2013-02-13 DIAGNOSIS — I70219 Atherosclerosis of native arteries of extremities with intermittent claudication, unspecified extremity: Secondary | ICD-10-CM | POA: Insufficient documentation

## 2013-02-13 DIAGNOSIS — J449 Chronic obstructive pulmonary disease, unspecified: Secondary | ICD-10-CM | POA: Insufficient documentation

## 2013-02-13 DIAGNOSIS — M129 Arthropathy, unspecified: Secondary | ICD-10-CM | POA: Insufficient documentation

## 2013-02-13 HISTORY — PX: PERCUTANEOUS STENT INTERVENTION: SHX5500

## 2013-02-13 HISTORY — PX: LOWER EXTREMITY ANGIOGRAM: SHX5508

## 2013-02-13 HISTORY — PX: ABDOMINAL AORTAGRAM: SHX5454

## 2013-02-13 SURGERY — Surgical Case
Anesthesia: *Unknown

## 2013-02-13 SURGERY — ABDOMINAL AORTAGRAM
Anesthesia: LOCAL | Laterality: Right

## 2013-02-13 MED ORDER — ASPIRIN 81 MG PO CHEW
324.0000 mg | CHEWABLE_TABLET | ORAL | Status: AC
  Administered 2013-02-13: 324 mg via ORAL
  Filled 2013-02-13: qty 4

## 2013-02-13 MED ORDER — LABETALOL HCL 5 MG/ML IV SOLN
10.0000 mg | Freq: Once | INTRAVENOUS | Status: AC
  Administered 2013-02-13: 10 mg via INTRAVENOUS
  Filled 2013-02-13: qty 4

## 2013-02-13 MED ORDER — MIDAZOLAM HCL 2 MG/2ML IJ SOLN
INTRAMUSCULAR | Status: AC
  Filled 2013-02-13: qty 2

## 2013-02-13 MED ORDER — SODIUM CHLORIDE 0.9 % IV SOLN: INTRAVENOUS | Status: AC

## 2013-02-13 MED ORDER — CLOPIDOGREL BISULFATE 75 MG PO TABS: 75.0000 mg | ORAL_TABLET | Freq: Every day | ORAL | Status: DC

## 2013-02-13 MED ORDER — SODIUM CHLORIDE 0.9 % IV SOLN
250.0000 mL | INTRAVENOUS | Status: DC | PRN
Start: 2013-02-13 — End: 2013-02-13

## 2013-02-13 MED ORDER — SODIUM CHLORIDE 0.9 % IV SOLN: INTRAVENOUS | Status: DC

## 2013-02-13 MED ORDER — SODIUM CHLORIDE 0.9 % IJ SOLN: 3.0000 mL | Freq: Two times a day (BID) | INTRAMUSCULAR | Status: DC

## 2013-02-13 MED ORDER — LABETALOL HCL 5 MG/ML IV SOLN: 10.0000 mg | Freq: Once | INTRAVENOUS | Status: DC

## 2013-02-13 MED ORDER — HEPARIN (PORCINE) IN NACL 2-0.9 UNIT/ML-% IJ SOLN
INTRAMUSCULAR | Status: AC
  Filled 2013-02-13: qty 1000

## 2013-02-13 MED ORDER — CLOPIDOGREL BISULFATE 300 MG PO TABS
ORAL_TABLET | ORAL | Status: AC
  Filled 2013-02-13: qty 2

## 2013-02-13 MED ORDER — SODIUM CHLORIDE 0.9 % IV SOLN
INTRAVENOUS | Status: DC
  Administered 2013-02-13: 100 mL/h via INTRAVENOUS

## 2013-02-13 MED ORDER — LIDOCAINE HCL (PF) 1 % IJ SOLN
INTRAMUSCULAR | Status: AC
  Filled 2013-02-13: qty 30

## 2013-02-13 MED ORDER — FENTANYL CITRATE 0.05 MG/ML IJ SOLN
INTRAMUSCULAR | Status: AC
  Filled 2013-02-13: qty 2

## 2013-02-13 MED ORDER — SODIUM CHLORIDE 0.9 % IJ SOLN: 3.0000 mL | INTRAMUSCULAR | Status: DC | PRN

## 2013-02-13 NOTE — Interval H&P Note (Signed)
History and Physical Interval Note:  02/13/2013 10:10 AM  Sean [name]  has presented today for surgery, with the diagnosis of pad  The various methods of treatment have been discussed with the patient and family. After consideration of risks, benefits and other options for treatment, the patient has consented to  Procedure(s): ABDOMINAL AORTAGRAM (N/A) as a surgical intervention .  The patient's history has been reviewed, patient examined, no change in status, stable for surgery.  I have reviewed the patient's chart and labs.  Questions were answered to the patient's satisfaction.     Lorine Bears

## 2013-02-13 NOTE — H&P (View-Only) (Signed)
  HPI  This is a 71-year-old African American male who was referred by Dr. Letvak for evaluation and management of peripheral arterial disease. He has prolonged history of tobacco use but no known coronary artery disease. He also has hypertension and hyperlipidemia. He has been having right hip and buttock discomfort with walking for more than a year and thought it was due to arthritis. He gets significant discomfort after about walking on the 75-100 yards which forces him to stop for about 3 minutes before he can resume. He has cut down on his activities due to that. He has exertional dyspnea without reported chest pain.  No Known Allergies   Current Outpatient Prescriptions on File Prior to Visit  Medication Sig Dispense Refill  . glucosamine-chondroitin 500-400 MG tablet Take 1 tablet by mouth daily.       No current facility-administered medications on file prior to visit.     Past Medical History  Diagnosis Date  . Arthritis   . Substance abuse   . Ulcer   . COPD (chronic obstructive pulmonary disease)   . ED (erectile dysfunction)   . Cervical disc disease   . Hyperlipidemia      Past Surgical History  Procedure Laterality Date  . Cervical disc surgery  1996  . Stomach surgery  1974    gastric ulcer     Family History  Problem Relation Age of Onset  . Arthritis Sister   . Hypertension Neg Hx   . Cancer Neg Hx     colon cancer     History   Social History  . Marital Status: Married    Spouse Name: N/A    Number of Children: 7  . Years of Education: N/A   Occupational History  . retired carpenter     Social History Main Topics  . Smoking status: Current Every Day Smoker -- 0.80 packs/day    Types: Cigarettes  . Smokeless tobacco: Never Used  . Alcohol Use: Yes     Comment: rarely  . Drug Use: 7.00 per week    Special: Marijuana  . Sexually Active: Not on file   Other Topics Concern  . Not on file   Social History Narrative   Tries to do  exercises regularly (TV shows)   No living will   Requests that wife make medical decisions for him if he is unable   Would accept CPR. Would accept mechanical ventilation. Would accept feeding tube     ROS Constitutional: Negative for fever, chills, diaphoresis, activity change, appetite change and fatigue.  HENT: Negative for hearing loss, nosebleeds, congestion, sore throat, facial swelling, drooling, trouble swallowing, neck pain, voice change, sinus pressure and tinnitus.  Eyes: Negative for photophobia, pain, discharge and visual disturbance.  Respiratory: Negative for apnea, cough, chest tightness and wheezing.  Cardiovascular: Negative for chest pain, palpitations and leg swelling.  Gastrointestinal: Negative for nausea, vomiting, abdominal pain, diarrhea, constipation, blood in stool and abdominal distention.  Genitourinary: Negative for dysuria, urgency, frequency, hematuria and decreased urine volume.  Skin: Negative for color change, pallor, rash and wound.  Neurological: Negative for dizziness, tremors, seizures, syncope, speech difficulty, weakness, light-headedness, numbness and headaches.  Psychiatric/Behavioral: Negative for suicidal ideas, hallucinations, behavioral problems and agitation. The patient is not nervous/anxious.     PHYSICAL EXAM   BP 183/79  Pulse 80  Wt 171 lb (77.565 kg)  BMI 26.01 kg/m2 Constitutional: He is oriented to person, place, and time. He appears well-developed and well-nourished. No   distress.  HENT: No nasal discharge.  Head: Normocephalic and atraumatic.  Eyes: Pupils are equal and round. Right eye exhibits no discharge. Left eye exhibits no discharge.  Neck: Normal range of motion. Neck supple. No JVD present. No thyromegaly present.  Cardiovascular: Normal rate, regular rhythm, normal heart sounds and. Exam reveals no gallop and no friction rub. No murmur heard.  Pulmonary/Chest: Effort normal and breath sounds normal. No stridor. No  respiratory distress. He has no wheezes. He has no rales. He exhibits no tenderness.  Abdominal: Soft. Bowel sounds are normal. He exhibits no distension. There is no tenderness. There is no rebound and no guarding.  Musculoskeletal: Normal range of motion. He exhibits no edema and no tenderness.  Neurological: He is alert and oriented to person, place, and time. Coordination normal.  Skin: Skin is warm and dry. No rash noted. He is not diaphoretic. No erythema. No pallor.  Psychiatric: He has a normal mood and affect. His behavior is normal. Judgment and thought content normal.  Vascular: Radial pulses normal bilaterally. Femoral pulses are diminished on the right side and slightly diminished on the left side. Distal pulses are not palpable.     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   ASSESSMENT AND PLAN   

## 2013-02-13 NOTE — CV Procedure (Signed)
PERIPHERAL VASCULAR PROCEDURE  NAME:  Sean [name]   MRN: [id number] DOB:  [birth year]   ADMIT DATE: 02/13/2013  Performing Cardiologist: Lorine Bears Primary Physician: Tillman Abide, MD  Procedures Performed:  Abdominal Aortic Angiogram with Bi-Iliofemoral Runoff  Bilateral Lower Extremity Angiography (1st Order)  Self-expanding stent placement to the right external iliac artery.  Mynx closure device.   Indication(s):   Claudication  Critical Limb Ischemia   Consent: The procedure with Risks/Benefits/Alternatives and Indications was reviewed with the patient .  All questions were answered.  Medications:  Sedation:  1 mg IV Versed, 25 mcg IV Fentanyl  Contrast:  145 mL  Visipaque   Procedural details: The right groin was prepped, draped, and anesthetized with 1% lidocaine. Using modified Seldinger technique, a 5 French sheath was introduced into the right common femoral artery. A 5 Fr Short Pigtail Catheter was advanced of over a  Versicore wire into the descending Aorta to a level just above the renal arteries. A power injection of 42ml/sec contrast over 1 sec was performed for Abdominal Aortic Angiography.  The catheter was then pulled back to a level just above the Aortic bifurcation, and a second power injection was performed to evaluate the iliac arteries.  Due to subtotal occlusion of the right external iliac artery, the intervention was performed before completing the runoff.  Bilateral lower extremity arterial run off was  performed via power injection of 7 ml / sec contrast for a total of 77 ml.    Interventional Procedure:  The sheath was exchanged into a 7 French Bright tip sheath. 4000 units of unfractionated heparin was given. 600 mg of Plavix was given close of the end of the case. The lesion was crossed with the versicolor wire. I then used a 6 cm balloon for measurement but decided not to predilate the lesion as it was noted to be a soft plaque  with risk of distal embolization. I decided to direct stent this with a 10 x 60 mm Smart self-expanding stent which was positioned carefully. The internal iliac artery had to be jailed by the stent as the plaque was extending all the way to the ostium. The stent was post dilated with an 8 x 60 mm balloon to 8 atmospheres. Angiography showed excellent results with normal flow in the internal iliac artery. The sheath was exchanged into a short 7 [place] sheath. The site was closed with a Mynx closure device.  Hemodynamics:  Central Aortic Pressure / Mean Aortic Pressure: 157/57  Findings:  Abdominal aorta: Normal in size with no evidence of aneurysm. There is mild diffuse atherosclerosis.  Left renal artery: Normal  Right renal artery: Normal  Celiac artery: Patent  Superior mesenteric artery: Patent  Right common iliac artery: Normal in size with minor irregularities.  Right internal iliac artery: Normal in size with diffuse 50-60% disease in the midsegment.  Right external iliac artery: Subtotally occluded with soft plaque.  Right common femoral artery: Minor irregularities.  Right profunda femoral artery: Normal  Right superficial femoral artery: Diffusely diseased proximally (50%) with a short occlusion in the midsegment. The SFA gets collaterals from the profunda.  Right popliteal artery: Normal  Three-vessel runoff below the knee but not well visualized due to motion.  Left common iliac artery:  30% ostial stenosis.  Left internal iliac artery: 90% ostial stenosis.  Left external iliac artery: Diffuse 50% disease.  Left common femoral artery: Minor irregularities.  Left profunda femoral artery: Normal  Left superficial  femoral artery:  Occluded proximally with reconstitution distally via collaterals from the profunda.  Left popliteal artery: Minor irregularities.  1 vessel runoff below the knee with a dominant vessel being the anterior tibial. The peroneal and  posterior tibial are diffusely diseased proximally and appears to be occluded in the midsegment.  Conclusions: 1. 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 2. Long occlusion of the left SFA with 1 vessel runoff below the knee. 3. Diffusely diseased SFA on the right side with short occlusion in the midsegment. Three-vessel runoff below the knee. 4. Successful stent placement to the right external iliac artery.  Recommendations:  Aggressive medical therapy is recommended. Plavix for at least one month. Smoking cessation is strongly advised. The SFA will likely be treated medically.   Lorine Bears, MD, [facility] 02/13/2013 11:26 AM

## 2013-02-13 NOTE — Progress Notes (Signed)
Pts BP much lower at 140/84.  Dr. Kirke Corin notified per order.  States ok for pt to go home.

## 2013-02-13 NOTE — Progress Notes (Signed)
Pts BP elevated. See vs.  Dr. Kirke Corin notified.  Orders received for Labetolol. Given

## 2013-02-15 ENCOUNTER — Encounter: Payer: Self-pay | Admitting: Cardiovascular Disease

## 2013-02-15 ENCOUNTER — Ambulatory Visit (INDEPENDENT_AMBULATORY_CARE_PROVIDER_SITE_OTHER): Payer: Medicare Other | Admitting: Cardiovascular Disease

## 2013-02-15 ENCOUNTER — Telehealth: Payer: Self-pay | Admitting: *Deleted

## 2013-02-15 VITALS — BP 152/74 | HR 82 | Ht 68.5 in | Wt 165.2 lb

## 2013-02-15 DIAGNOSIS — R0602 Shortness of breath: Secondary | ICD-10-CM | POA: Diagnosis not present

## 2013-02-15 DIAGNOSIS — I10 Essential (primary) hypertension: Secondary | ICD-10-CM

## 2013-02-15 DIAGNOSIS — E785 Hyperlipidemia, unspecified: Secondary | ICD-10-CM | POA: Diagnosis not present

## 2013-02-15 DIAGNOSIS — I739 Peripheral vascular disease, unspecified: Secondary | ICD-10-CM

## 2013-02-15 DIAGNOSIS — Z720 Tobacco use: Secondary | ICD-10-CM

## 2013-02-15 DIAGNOSIS — F17200 Nicotine dependence, unspecified, uncomplicated: Secondary | ICD-10-CM

## 2013-02-15 MED ORDER — TRAMADOL HCL 50 MG PO TABS: 50.0000 mg | ORAL_TABLET | Freq: Four times a day (QID) | ORAL | Status: DC | PRN

## 2013-02-15 NOTE — Telephone Encounter (Signed)
I asked his wife to bring him now for a check. This patient does not usually complain much.

## 2013-02-15 NOTE — Assessment & Plan Note (Signed)
The patient will require stress testing in the near future. There is a high chance of underlying obstructive coronary artery disease.

## 2013-02-15 NOTE — Progress Notes (Signed)
HPI  This is a 72 year old African American male who was  added to my schedule today due to discomfort that the right groin cath site.He has prolonged history of tobacco use but no known coronary artery disease. He also has hypertension and hyperlipidemia. He was seen recently for right hip and buttock discomfort with walking 75-100 yards. His ABI was significantly abnormal bilaterally. I performed angiography on Wednesday which showed subtotal occlusion of the right external iliac artery, long occlusion of the left SFA with 1 vessel runoff below the knee, diffusely diseased right SFA with three-vessel runoff below the knee. I performed self-expanding stent placement to the right external iliac artery without complications. The site   was closed with a Mynx device. His blood pressure was running high after the procedure he was given IV labetalol and hydralazine. He was discharged on the same day. We called him today to schedule a followup appointment and he complained of right groin pain at the cath site. Thus, he was brought for evaluation. The pain is only localized at the cath site and does not radiate down his right leg. The leg is warm. There is no swelling or bleeding. No Known Allergies   Current Outpatient Prescriptions on File Prior to Visit  Medication Sig Dispense Refill  . amLODipine (NORVASC) 5 MG tablet Take 1 tablet (5 mg total) by mouth daily.  30 tablet  6  . aspirin 81 MG tablet Take 81 mg by mouth daily.      Marland Kitchen atorvastatin (LIPITOR) 20 MG tablet Take 1 tablet (20 mg total) by mouth at bedtime.  30 tablet  6  . clopidogrel (PLAVIX) 75 MG tablet Take 1 tablet (75 mg total) by mouth daily.  90 tablet  6  . glucosamine-chondroitin 500-400 MG tablet Take 1 tablet by mouth daily.       No current facility-administered medications on file prior to visit.     Past Medical History  Diagnosis Date  . Arthritis   . Substance abuse   . Ulcer   . COPD (chronic obstructive  pulmonary disease)   . ED (erectile dysfunction)   . Cervical disc disease   . Hyperlipidemia   . PAD (peripheral artery disease) 02/05/2013    05/14: Subtotally occluded right external iliac artery, long occlusion of the left proximal to mid SFA with 1 vessel runoff below the knee, diffusely diseased right SFA with short occlusion in the midsegment with three-vessel runoff below the knee. Status post self-expanding stent placement to the right external iliac artery.     Past Surgical History  Procedure Laterality Date  . Cervical disc surgery  1996  . Stomach surgery  1974    gastric ulcer  . Iliac artery stent      right stent placement     Family History  Problem Relation Age of Onset  . Arthritis Sister   . Hypertension Neg Hx   . Cancer Neg Hx     colon cancer     History   Social History  . Marital Status: Married    Spouse Name: N/A    Number of Children: 7  . Years of Education: N/A   Occupational History  . retired Music therapist     Social History Main Topics  . Smoking status: Current Every Day Smoker -- 0.25 packs/day    Types: Cigarettes  . Smokeless tobacco: Never Used  . Alcohol Use: Yes     Comment: rarely  . Drug Use: 7.00 per  week    Special: Marijuana  . Sexually Active: Not on file   Other Topics Concern  . Not on file   Social History Narrative   Tries to do exercises regularly (TV shows)   No living will   Requests that wife make medical decisions for him if he is unable   Would accept CPR. Would accept mechanical ventilation. Would accept feeding tube      PHYSICAL EXAM   BP 152/74  Pulse 82  Ht 5' 8.5" (1.74 m)  Wt 165 lb 4 oz (74.957 kg)  BMI 24.76 kg/m2 Constitutional: He is oriented to person, place, and time. He appears well-developed and well-nourished. No distress.  HENT: No nasal discharge.  Head: Normocephalic and atraumatic.  Eyes: Pupils are equal and round. Right eye exhibits no discharge. Left eye exhibits no  discharge.  Neck: Normal range of motion. Neck supple. No JVD present. No thyromegaly present.  Cardiovascular: Normal rate, regular rhythm, normal heart sounds and. Exam reveals no gallop and no friction rub. No murmur heard.  Pulmonary/Chest: Effort normal and breath sounds normal. No stridor. No respiratory distress. He has no wheezes. He has no rales. He exhibits no tenderness.  Abdominal: Soft. Bowel sounds are normal. He exhibits no distension. There is no tenderness. There is no rebound and no guarding.  Musculoskeletal: Normal range of motion. He exhibits no edema and no tenderness.  Neurological: He is alert and oriented to person, place, and time. Coordination normal.  Skin: Skin is warm and dry. No rash noted. He is not diaphoretic. No erythema. No pallor.  Psychiatric: He has a normal mood and affect. His behavior is normal. Judgment and thought content normal.  Vascular: Radial pulses normal bilaterally. Femoral pulses are diminished on the right side and slightly diminished on the left side. Distal pulses are not palpable. The right groin is with no hematoma. There is a very small scar tissue with tenderness. There is no bruit.     EKG: Normal sinus rhythm with latrial enlargement and left anterior fascicular block.   ASSESSMENT AND PLAN

## 2013-02-15 NOTE — Telephone Encounter (Signed)
Wife states pt is very tender at groin area where catheter was inserted Says pt is waling "bent over" because it hurts to stand up straight Denies visible/palpable hematoma at site Confirms both feet warm to touch C/o pain in right LE when walking No numbness/tingling Denies lower back pain/pain elsewhere that may indicate retroperitoneal bleed I reassured wife some tenderness at site of cath can be expected but I will make Dr. Kirke Corin aware and call her back Understanding verb

## 2013-02-15 NOTE — Assessment & Plan Note (Addendum)
I started him on atorvastatin. He will need a followup lipid and liver profile in the near future.

## 2013-02-15 NOTE — Assessment & Plan Note (Signed)
The patient had successful stent placement to the right external iliac artery for subtotal occlusion. He is complaining of discomfort at the cath site. By Physical Exam, there is no evidence of hematoma or bruit. The tenderness seems to be localized. The leg is warm. I will schedule him for Doppler evaluation to make sure he does not have a small pseudoaneurysm especially that his blood pressure was running high after the procedure. I gave him, tramadol 50 mg every 6 hours as needed. Would obtain an ABI at the same time with Doppler. Continue treatment with aspirin and Plavix. I plan on treating the SFA disease medically for the time being.

## 2013-02-15 NOTE — Patient Instructions (Addendum)
Start Tramadol 50 mg every 6 hours as needed for pain.  Schedule ABI and Groin ultrasound next week.  Follow up in 3 weeks.

## 2013-02-15 NOTE — Assessment & Plan Note (Signed)
He was counseled extensively about the importance of smoking cessation after his procedure. I will need to discuss with him again upon followup.

## 2013-02-15 NOTE — Telephone Encounter (Signed)
Called patient to schedule his abi's and f/u visit with Dr. Kirke Corin, spoke with the wife who says patient is having a lot of pain in the leg area when the stent was placed and she wanted to know is this normal after this type procedure. Would like RN to call her to discuss.

## 2013-02-15 NOTE — Assessment & Plan Note (Signed)
His blood pressure improved after the addition of amlodipine. He will likely require another medication.

## 2013-02-20 ENCOUNTER — Telehealth: Payer: Self-pay | Admitting: Cardiovascular Disease

## 2013-02-20 ENCOUNTER — Encounter (INDEPENDENT_AMBULATORY_CARE_PROVIDER_SITE_OTHER): Payer: Medicare Other

## 2013-02-20 DIAGNOSIS — R0989 Other specified symptoms and signs involving the circulatory and respiratory systems: Secondary | ICD-10-CM | POA: Diagnosis not present

## 2013-02-20 DIAGNOSIS — I739 Peripheral vascular disease, unspecified: Secondary | ICD-10-CM | POA: Diagnosis not present

## 2013-02-20 DIAGNOSIS — M79609 Pain in unspecified limb: Secondary | ICD-10-CM

## 2013-02-20 NOTE — Telephone Encounter (Signed)
Patient needs a refill on his Rx: pain medication ?, patient is unsure of the name

## 2013-02-20 NOTE — Telephone Encounter (Signed)
I discussed with Dr. Kirke Corin, who asks that I check with [facility] u/s tech re: results from today Per Sean [name] in echo LE art duplex was WNL and ABI on Right improved from 0.40's to 0.70's  Dr. Kirke Corin made aware. He asks that I call pt to see if he is completely out and reassure pt test were WNL  I explained this to pt who says he has 15 tablets left but is having to take med q6hours for the RLE pain and would like a refill "in case I run out and need this medication". I reassured pt of normal test results. I had him hold while I informed Dr. Kirke Corin.  Per Dr. Kirke Corin, pt should wait until he is completely out of medication then can call us if still in pain, otherwise pain is coming from another source.  Pt informed Understanding verb He will call us when out of tramadol and if still in pain at that time

## 2013-02-20 NOTE — Telephone Encounter (Signed)
Do you want me to refill this???

## 2013-03-12 ENCOUNTER — Encounter: Payer: Self-pay | Admitting: Cardiovascular Disease

## 2013-03-12 ENCOUNTER — Ambulatory Visit (INDEPENDENT_AMBULATORY_CARE_PROVIDER_SITE_OTHER): Payer: Medicare Other | Admitting: Cardiovascular Disease

## 2013-03-12 VITALS — BP 173/79 | HR 72 | Ht 68.0 in | Wt 167.2 lb

## 2013-03-12 DIAGNOSIS — R0989 Other specified symptoms and signs involving the circulatory and respiratory systems: Secondary | ICD-10-CM

## 2013-03-12 DIAGNOSIS — E785 Hyperlipidemia, unspecified: Secondary | ICD-10-CM | POA: Diagnosis not present

## 2013-03-12 DIAGNOSIS — I10 Essential (primary) hypertension: Secondary | ICD-10-CM

## 2013-03-12 DIAGNOSIS — R0600 Dyspnea, unspecified: Secondary | ICD-10-CM

## 2013-03-12 DIAGNOSIS — I739 Peripheral vascular disease, unspecified: Secondary | ICD-10-CM

## 2013-03-12 DIAGNOSIS — R0602 Shortness of breath: Secondary | ICD-10-CM

## 2013-03-12 DIAGNOSIS — Z720 Tobacco use: Secondary | ICD-10-CM

## 2013-03-12 DIAGNOSIS — R0609 Other forms of dyspnea: Secondary | ICD-10-CM | POA: Diagnosis not present

## 2013-03-12 DIAGNOSIS — F17200 Nicotine dependence, unspecified, uncomplicated: Secondary | ICD-10-CM

## 2013-03-12 MED ORDER — HYDROCHLOROTHIAZIDE 25 MG PO TABS: 25.0000 mg | ORAL_TABLET | Freq: Every day | ORAL | Status: DC

## 2013-03-12 NOTE — Progress Notes (Signed)
HPI  This is a 72 year old [place] American male who is here today for followup visit.  He has prolonged history of tobacco use but no known coronary artery disease. He also has hypertension and hyperlipidemia. He was seen recently for right hip and buttock discomfort with walking 75-100 yards. His ABI was significantly abnormal bilaterally. 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 I performed self-expanding stent placement to the right external iliac artery without complications. He was seen shortly few days after the procedure due to discomfort at the groin site. He underwent duplex ultrasound which showed no evidence of pseudoaneurysm. The discomfort improved with short-term tramadol. He reports resolution of right calf claudication. He denies symptoms on the left side. He continues to complain of exertional dyspnea without chest pain. His blood pressure is still elevated.   No Known Allergies   Current Outpatient Prescriptions on File Prior to Visit  Medication Sig Dispense Refill  . amLODipine (NORVASC) 5 MG tablet Take 1 tablet (5 mg total) by mouth daily.  30 tablet  6  . aspirin 81 MG tablet Take 81 mg by mouth daily.      Marland Kitchen atorvastatin (LIPITOR) 20 MG tablet Take 1 tablet (20 mg total) by mouth at bedtime.  30 tablet  6  . clopidogrel (PLAVIX) 75 MG tablet Take 1 tablet (75 mg total) by mouth daily.  90 tablet  6  . glucosamine-chondroitin 500-400 MG tablet Take 1 tablet by mouth daily.       No current facility-administered medications on file prior to visit.     Past Medical History  Diagnosis Date  . Arthritis   . Substance abuse   . Ulcer   . COPD (chronic obstructive pulmonary disease)   . ED (erectile dysfunction)   . Cervical disc disease   . Hyperlipidemia   . PAD (peripheral  artery disease) 02/05/2013    05/14: Subtotally occluded right external iliac artery, long occlusion of the left proximal to mid SFA with 1 vessel runoff below the knee, diffusely diseased right SFA with short occlusion in the midsegment with three-vessel runoff below the knee. Status post self-expanding stent placement to the right external iliac artery.     Past Surgical History  Procedure Laterality Date  . Cervical disc surgery  1996  . Stomach surgery  1974    gastric ulcer  . Iliac artery stent      right stent placement     Family History  Problem Relation Age of Onset  . Arthritis Sister   . Hypertension Neg Hx   . Cancer Neg Hx     colon cancer     History   Social History  . Marital Status: Married    Spouse Name: N/A    Number of Children: 7  . Years of Education: N/A   Occupational History  . retired Music therapist     Social History Main Topics  . Smoking status: Current Every Day Smoker -- 0.25 packs/day for 54 years    Types: Cigarettes  . Smokeless tobacco: Never Used  . Alcohol Use: Yes     Comment: rarely  . Drug Use: 7.00 per week    Special: Marijuana  . Sexually Active: Not on file   Other Topics Concern  . Not on file   Social History Narrative  Tries to do exercises regularly (TV shows)   No living will   Requests that wife make medical decisions for him if he is unable   Would accept CPR. Would accept mechanical ventilation. Would accept feeding tube      PHYSICAL EXAM   BP 173/79  Pulse 72  Ht 5\' 8"  (1.727 m)  Wt 167 lb 4 oz (75.864 kg)  BMI 25.44 kg/m2 Constitutional: He is oriented to person, place, and time. He appears well-developed and well-nourished. No distress.  HENT: No nasal discharge.  Head: Normocephalic and atraumatic.  Eyes: Pupils are equal and round. Right eye exhibits no discharge. Left eye exhibits no discharge.  Neck: Normal range of motion. Neck supple. No JVD present. No thyromegaly present.    Cardiovascular: Normal rate, regular rhythm, normal heart sounds and. Exam reveals no gallop and no friction rub. No murmur heard.  Pulmonary/Chest: Effort normal and breath sounds normal. No stridor. No respiratory distress. He has no wheezes. He has no rales. He exhibits no tenderness.  Abdominal: Soft. Bowel sounds are normal. He exhibits no distension. There is no tenderness. There is no rebound and no guarding.  Musculoskeletal: Normal range of motion. He exhibits no edema and no tenderness.  Neurological: He is alert and oriented to person, place, and time. Coordination normal.  Skin: Skin is warm and dry. No rash noted. He is not diaphoretic. No erythema. No pallor.  Psychiatric: He has a normal mood and affect. His behavior is normal. Judgment and thought content normal.  Vascular: Radial pulses normal bilaterally. Femoral pulses are diminished on the right side and slightly diminished on the left side. Distal pulses are not palpable. The right groin is with no hematoma. Femoral pulses are normal bilaterally. 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   ASSESSMENT AND PLAN

## 2013-03-12 NOTE — Assessment & Plan Note (Signed)
The patient had complete resolution of right leg claudication after recent stent placement to the right external iliac artery. His ABI improved from 0.42 to 0.74. ABI in the left side was 0.68. I plan on treating his SFA disease medically. Continue dual antiplatelet therapy likely long-term.

## 2013-03-12 NOTE — Assessment & Plan Note (Signed)
I had a prolonged discussion with him about the importance of smoking cessation. 

## 2013-03-12 NOTE — Assessment & Plan Note (Signed)
His blood pressure is still elevated. I will add hydrochlorothiazide 25 mg once daily. Check basic metabolic profile in one week.

## 2013-03-12 NOTE — Assessment & Plan Note (Signed)
The patient continues to have significant exertional dyspnea without chest pain. His baseline ECG is abnormal with possible old inferior infarct. He has multiple risk factors for coronary artery disease and thus I recommend evaluation with a treadmill nuclear stress test. A treadmill stress test alone would not be sufficient due to presence of left anterior fascicular block on his EKG.

## 2013-03-12 NOTE — Assessment & Plan Note (Signed)
Continue treatment with atorvastatin. I will request a followup lipid and liver profile.

## 2013-03-12 NOTE — Patient Instructions (Addendum)
Fasting labs in 1 week from now.   Start Hydrochlorothiazide 25 mg once daily for high blood pressure.  Continue other medications.   Your physician has requested that you have en exercise stress myoview. For further information please visit [email or web address] Please follow instruction sheet, as given.  Follow up after tests.

## 2013-03-14 ENCOUNTER — Ambulatory Visit: Payer: Self-pay | Admitting: Cardiovascular Disease

## 2013-03-14 DIAGNOSIS — R609 Edema, unspecified: Secondary | ICD-10-CM | POA: Diagnosis not present

## 2013-03-14 DIAGNOSIS — R030 Elevated blood-pressure reading, without diagnosis of hypertension: Secondary | ICD-10-CM | POA: Diagnosis not present

## 2013-03-18 ENCOUNTER — Other Ambulatory Visit: Payer: Self-pay

## 2013-03-18 DIAGNOSIS — R0600 Dyspnea, unspecified: Secondary | ICD-10-CM

## 2013-03-19 ENCOUNTER — Ambulatory Visit (INDEPENDENT_AMBULATORY_CARE_PROVIDER_SITE_OTHER): Payer: Medicare Other

## 2013-03-19 DIAGNOSIS — I10 Essential (primary) hypertension: Secondary | ICD-10-CM

## 2013-03-20 LAB — BASIC METABOLIC PANEL
GFR calc Af Amer: 70 mL/min/{1.73_m2} (ref >59)
GFR calc non Af Amer: 60 mL/min/{1.73_m2} (ref >59)
Potassium: 4.7 mmol/L (ref 3.5–5.2)
Sodium: 140 mmol/L (ref 134–144)

## 2013-04-01 ENCOUNTER — Encounter: Payer: Self-pay | Admitting: Cardiovascular Disease

## 2013-04-01 ENCOUNTER — Ambulatory Visit (INDEPENDENT_AMBULATORY_CARE_PROVIDER_SITE_OTHER): Payer: Medicare Other | Admitting: Cardiovascular Disease

## 2013-04-01 VITALS — BP 130/70 | HR 89 | Ht 68.5 in | Wt 167.0 lb

## 2013-04-01 DIAGNOSIS — I739 Peripheral vascular disease, unspecified: Secondary | ICD-10-CM | POA: Diagnosis not present

## 2013-04-01 DIAGNOSIS — I2510 Atherosclerotic heart disease of native coronary artery without angina pectoris: Secondary | ICD-10-CM

## 2013-04-01 DIAGNOSIS — E785 Hyperlipidemia, unspecified: Secondary | ICD-10-CM | POA: Diagnosis not present

## 2013-04-01 DIAGNOSIS — I10 Essential (primary) hypertension: Secondary | ICD-10-CM

## 2013-04-01 NOTE — Assessment & Plan Note (Signed)
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 Ejection fraction was 52%. Based on this, he likely has underlying coronary artery disease. His symptoms have improved with medical therapy and thus, there is no urgent need to proceed with cardiac catheterization. He is on good medical therapy at this point.

## 2013-04-01 NOTE — Assessment & Plan Note (Signed)
Continue treatment with atorvastatin. Check fasting lipid and liver profile today. 

## 2013-04-01 NOTE — Progress Notes (Signed)
HPI  This is a 72 year old African American male who is here today for followup visit regarding peripheral arterial disease and slightly abnormal stress test..  He has prolonged history of tobacco , hypertension and hyperlipidemia. He was seen in may of 2014 for right hip and buttock discomfort with walking 75-100 yards. His ABI was significantly abnormal bilaterally. 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 I performed self-expanding stent placement to the right external iliac artery without complications.  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 Ejection fraction was 52%. Over all slightly abnormal stress test but low risk. His blood pressure was running high and I added hydrochlorothiazide 25 mg once daily. He is overall feeling better. He denies lower extremity claudication.   No Known Allergies   Current Outpatient Prescriptions on File Prior to Visit  Medication Sig Dispense Refill  . amLODipine (NORVASC) 5 MG tablet Take 1 tablet (5 mg total) by mouth daily.  30 tablet  6  . aspirin 81 MG tablet Take 81 mg by mouth daily.      Marland Kitchen atorvastatin (LIPITOR) 20 MG tablet Take 1 tablet (20 mg total) by mouth at bedtime.  30 tablet  6  . clopidogrel (PLAVIX) 75 MG tablet Take 1 tablet (75 mg total) by mouth daily.  90 tablet  6  . glucosamine-chondroitin 500-400 MG tablet Take 1 tablet by mouth daily.      . hydrochlorothiazide (HYDRODIURIL) 25 MG tablet Take 1 tablet (25 mg total) by mouth daily.  30 tablet  6   No current facility-administered medications on file prior to visit.     Past Medical History  Diagnosis Date  . Arthritis   . Substance abuse   . Ulcer    . COPD (chronic obstructive pulmonary disease)   . ED (erectile dysfunction)   . Cervical disc disease   . Hyperlipidemia   . PAD (peripheral artery disease) 02/05/2013    05/14: Subtotally occluded right external iliac artery, long occlusion of the left proximal to mid SFA with 1 vessel runoff below the knee, diffusely diseased right SFA with short occlusion in the midsegment with three-vessel runoff below the knee. Status post self-expanding stent placement to the right external iliac artery.     Past Surgical History  Procedure Laterality Date  . Cervical disc surgery  1996  . Stomach surgery  1974    gastric ulcer  . Iliac artery stent      right stent placement     Family History  Problem Relation Age of Onset  . Arthritis Sister   . Hypertension Neg Hx   . Cancer Neg Hx     colon cancer     History   Social History  . Marital Status: Married    Spouse Name: N/A    Number of Children: 7  . Years of Education: N/A   Occupational History  . retired Music therapist     Social History Main Topics  . Smoking status: Current Every Day Smoker -- 0.25 packs/day for 54 years    Types: Cigarettes  . Smokeless tobacco: Never Used  . Alcohol Use: Yes     Comment: rarely  . Drug Use: 7.00 per week  Special: Marijuana  . Sexually Active: Not on file   Other Topics Concern  . Not on file   Social History Narrative   Tries to do exercises regularly (TV shows)   No living will   Requests that wife make medical decisions for him if he is unable   Would accept CPR. Would accept mechanical ventilation. Would accept feeding tube      PHYSICAL EXAM   There were no vitals taken for this visit. Constitutional: He is oriented to person, place, and time. He appears well-developed and well-nourished. No distress.  HENT: No nasal discharge.  Head: Normocephalic and atraumatic.  Eyes: Pupils are equal and round. Right eye exhibits no discharge. Left eye exhibits no  discharge.  Neck: Normal range of motion. Neck supple. No JVD present. No thyromegaly present.  Cardiovascular: Normal rate, regular rhythm, normal heart sounds and. Exam reveals no gallop and no friction rub. No murmur heard.  Pulmonary/Chest: Effort normal and breath sounds normal. No stridor. No respiratory distress. He has no wheezes. He has no rales. He exhibits no tenderness.  Abdominal: Soft. Bowel sounds are normal. He exhibits no distension. There is no tenderness. There is no rebound and no guarding.  Musculoskeletal: Normal range of motion. He exhibits no edema and no tenderness.  Neurological: He is alert and oriented to person, place, and time. Coordination normal.  Skin: Skin is warm and dry. No rash noted. He is not diaphoretic. No erythema. No pallor.  Psychiatric: He has a normal mood and affect. His behavior is normal. Judgment and thought content normal.  Vascular: Radial pulses normal bilaterally. Femoral pulses are normal bilaterally. Distal pulses are not palpable     EKG: Sinus  Rhythm  -Left axis -anterior fascicular block.   -Right atrial enlargement.   Right atrial abnormality and rotation -possible pulmonary disease.   ABNORMAL    ASSESSMENT AND PLAN

## 2013-04-01 NOTE — Assessment & Plan Note (Signed)
His claudication has resolved after stenting of the right external iliac artery. He still has significant SFA disease and below the knee disease on the left side. Continue aggressive treatment of risk factors. I again discussed with him the importance of smoking cessation. I will obtain an aortoiliac duplex in 2 months from now.

## 2013-04-01 NOTE — Assessment & Plan Note (Signed)
His blood pressure is now well controlled on current medications. 

## 2013-04-01 NOTE — Patient Instructions (Addendum)
Labs today.   Schedule aortoiliac Duplex in 2 months.  Your physician has requested that you have an abdominal aorta duplex. During this test, an ultrasound is used to evaluate the aorta. Allow 30 minutes for this exam. Do not eat after midnight the day before and avoid carbonated beverages   Your physician wants you to follow-up in: 6 months You will receive a reminder letter in the mail two months in advance. If you don't receive a letter, please call our office to schedule the follow-up appointment.

## 2013-04-02 LAB — LIPID PANEL
Cholesterol, Total: 197 mg/dL (ref 100–199)
LDL Calculated: 124 mg/dL — ABNORMAL HIGH (ref 0–99)

## 2013-04-02 LAB — HEPATIC FUNCTION PANEL
Alkaline Phosphatase: 125 IU/L — ABNORMAL HIGH (ref 39–117)
Bilirubin, Direct: 0.11 mg/dL (ref 0.00–0.40)
Total Protein: 7.1 g/dL (ref 6.0–8.5)

## 2013-05-20 ENCOUNTER — Telehealth: Payer: PRIVATE HEALTH INSURANCE | Admitting: *Deleted

## 2013-05-20 ENCOUNTER — Ambulatory Visit (INDEPENDENT_AMBULATORY_CARE_PROVIDER_SITE_OTHER): Payer: Medicare Other | Admitting: *Deleted

## 2013-05-20 DIAGNOSIS — K921 Melena: Secondary | ICD-10-CM | POA: Diagnosis not present

## 2013-05-20 NOTE — Telephone Encounter (Signed)
Spoke w/pt. °

## 2013-05-20 NOTE — Telephone Encounter (Signed)
Patient called had a bloody stool and is concerned its due to his medication, please advise

## 2013-05-20 NOTE — Telephone Encounter (Signed)
Spoke w/ pt.  He understands to resume his other meds, but stop his aspirin and plavix.  He will come in to the office immediately for a CBC.

## 2013-05-20 NOTE — Telephone Encounter (Signed)
Spoke w/ pt.  He reports that he had a "bright red stool", no hemorrhoids (he did 15-20 yrs ago, but not recently), he denies any abdominal pain, diarrhea, or constipation. He stopped ALL of his medications, as he thought maybe they were causing this and will not take anything until Dr. Kirke Corin tells him it's okay to do so.

## 2013-05-20 NOTE — Telephone Encounter (Signed)
He should only stop Aspirin and Plavix for now. Continue other medications. Check CBC stat. Further recommendations to follow after I review the CBC.

## 2013-05-21 LAB — CBC WITH DIFFERENTIAL/PLATELET
Basos: 1 % (ref 0–3)
Eos: 4 % (ref 0–5)
Eosinophils Absolute: 0.2 10*3/uL (ref 0.0–0.4)
Hemoglobin: 12.2 g/dL — ABNORMAL LOW (ref 12.6–17.7)
Lymphs: 37 % (ref 14–46)
MCH: 26.9 pg (ref 26.6–33.0)
Monocytes: 7 % (ref 4–12)
Neutrophils Absolute: 3.3 10*3/uL (ref 1.4–7.0)
RBC: 4.54 x10E6/uL (ref 4.14–5.80)
WBC: 6.2 10*3/uL (ref 3.4–10.8)

## 2013-05-22 ENCOUNTER — Encounter: Payer: Self-pay | Admitting: Gastroenterology

## 2013-05-22 NOTE — Telephone Encounter (Signed)
Pt sched to see Dr. Christella Hartigan at [facility] 06/26/13 @ 10:00.

## 2013-05-22 NOTE — Telephone Encounter (Signed)
Hgb was 12.2 which is mildly reduced. I spoke with the patient today. He had bloody stool only once on Friday and none since then.  I asked him to resume Aspirin 81 mg daily without plavix.  Refer him to Dr. Christella Hartigan ([facility]) for evaluation. Dr. Christella Hartigan evaluated the patient before in 2013.

## 2013-06-05 ENCOUNTER — Encounter (INDEPENDENT_AMBULATORY_CARE_PROVIDER_SITE_OTHER): Payer: Medicare Other

## 2013-06-05 DIAGNOSIS — I700 Atherosclerosis of aorta: Secondary | ICD-10-CM

## 2013-06-05 DIAGNOSIS — I739 Peripheral vascular disease, unspecified: Secondary | ICD-10-CM

## 2013-06-14 ENCOUNTER — Telehealth: Payer: Self-pay

## 2013-06-14 NOTE — Telephone Encounter (Signed)
Spoke w/ pt.  He is aware of results.  He will make a note and call the office to schedule an appt for tests when his schedule permits. Reminder in computer for me to call him in April 2015.

## 2013-06-14 NOTE — Telephone Encounter (Signed)
Message copied by Marilynne Halsted on Fri Jun 14, 2013  3:51 PM ------      Message from: Lorine Bears A      Created: Wed Jun 12, 2013  4:01 PM       Duplex showed patent iliac stent. Schedule a follow up aortoiliac Duplex and ABI in 6 months. ------

## 2013-06-19 ENCOUNTER — Telehealth: Payer: Self-pay

## 2013-06-19 ENCOUNTER — Other Ambulatory Visit: Payer: Self-pay

## 2013-06-19 DIAGNOSIS — I25810 Atherosclerosis of coronary artery bypass graft(s) without angina pectoris: Secondary | ICD-10-CM

## 2013-06-19 DIAGNOSIS — I739 Peripheral vascular disease, unspecified: Secondary | ICD-10-CM

## 2013-06-19 NOTE — Telephone Encounter (Signed)
Message copied by Marilynne Halsted on Wed Jun 19, 2013 12:09 PM ------      Message from: Rhea Belton R      Created: Fri Jun 14, 2013  3:56 PM       Aortoiliac duplex and ABI...6 month f/u needed in April 2015 ------

## 2013-06-26 ENCOUNTER — Encounter: Payer: Self-pay | Admitting: Gastroenterology

## 2013-06-26 ENCOUNTER — Ambulatory Visit (INDEPENDENT_AMBULATORY_CARE_PROVIDER_SITE_OTHER): Payer: Medicare Other | Admitting: Gastroenterology

## 2013-06-26 VITALS — BP 158/78 | HR 96 | Ht 67.0 in | Wt 166.5 lb

## 2013-06-26 DIAGNOSIS — K649 Unspecified hemorrhoids: Secondary | ICD-10-CM | POA: Diagnosis not present

## 2013-06-26 DIAGNOSIS — K625 Hemorrhage of anus and rectum: Secondary | ICD-10-CM | POA: Diagnosis not present

## 2013-06-26 NOTE — Progress Notes (Signed)
Review of pertinent gastrointestinal problems: 1. Adenomatous polyps colonoscopy 2007; repeat colonoscoyp Christella Hartigan, 11/2011 found 3 small adenomas, repeat colonsocopy recommended at 3 year interval.   HPI: This is a   very pleasant 72 year old man whom I last saw about a year ago at the time of the colonoscopy. He is here for a new problem today.  Hb 12.2 last month (was >13 previously)  He had a single bright red BM.  One time, no associated straining or pushing.   He was taking plavix, was told to stop the plavix after the rectal bleeding. ASA was continued at 81mg , day dosing.   Review of systems: Pertinent positive and negative review of systems were noted in the above HPI section. Complete review of systems was performed and was otherwise normal.    Past Medical History  Diagnosis Date  . Arthritis   . Substance abuse   . Peptic ulcer   . COPD (chronic obstructive pulmonary disease)   . ED (erectile dysfunction)   . Cervical disc disease   . Hyperlipidemia   . PAD (peripheral artery disease) 02/05/2013    05/14: Subtotally occluded right external iliac artery, long occlusion of the left proximal to mid SFA with 1 vessel runoff below the knee, diffusely diseased right SFA with short occlusion in the midsegment with three-vessel runoff below the knee. Status post self-expanding stent placement to the right external iliac artery.    Past Surgical History  Procedure Laterality Date  . Cervical disc surgery  1996  . Stomach surgery  1974    gastric ulcer  . Iliac artery stent      right stent placement  . Harvest bone marrow      Donor    Current Outpatient Prescriptions  Medication Sig Dispense Refill  . aspirin 81 MG tablet Take 81 mg by mouth daily.      Marland Kitchen atorvastatin (LIPITOR) 20 MG tablet Take 1 tablet (20 mg total) by mouth at bedtime.  30 tablet  6  . clopidogrel (PLAVIX) 75 MG tablet Take 1 tablet (75 mg total) by mouth daily.  90 tablet  6  .  glucosamine-chondroitin 500-400 MG tablet Take 1 tablet by mouth daily.      . hydrochlorothiazide (HYDRODIURIL) 25 MG tablet Take 1 tablet (25 mg total) by mouth daily.  30 tablet  6  . amLODipine (NORVASC) 5 MG tablet Take 1 tablet (5 mg total) by mouth daily.  30 tablet  6   No current facility-administered medications for this visit.    Allergies as of 06/26/2013  . (No Known Allergies)    Family History  Problem Relation Age of Onset  . Arthritis Sister   . Hypertension Neg Hx   . Cancer Neg Hx     colon cancer  . Heart disease Father   . Clotting disorder Mother     blod clot  . Leukemia Son     bone marrow transplant    History   Social History  . Marital Status: Married    Spouse Name: N/A    Number of Children: 7  . Years of Education: N/A   Occupational History  . retired Music therapist     Social History Main Topics  . Smoking status: Current Every Day Smoker -- 0.25 packs/day for 54 years    Types: Cigarettes  . Smokeless tobacco: Never Used  . Alcohol Use: Yes     Comment: rarely  . Drug Use: 7.00 per week    Special:  Marijuana  . Sexual Activity: Not on file   Other Topics Concern  . Not on file   Social History Narrative   Tries to do exercises regularly (TV shows)   No living will   Requests that wife make medical decisions for him if he is unable   Would accept CPR. Would accept mechanical ventilation. Would accept feeding tube       Physical Exam: BP 158/78  Pulse 96  Ht 5\' 7"  (1.702 m)  Wt 166 lb 8 oz (75.524 kg)  BMI 26.07 kg/m2 Constitutional: generally well-appearing Psychiatric: alert and oriented x3 Eyes: extraocular movements intact Mouth: oral pharynx moist, no lesions Neck: supple no lymphadenopathy Cardiovascular: heart regular rate and rhythm Lungs: clear to auscultation bilaterally Abdomen: soft, nontender, nondistended, no obvious ascites, no peritoneal signs, normal bowel sounds Extremities: no lower extremity edema  bilaterally Skin: no lesions on visible extremities Rectal examination: There were nonthrombosed, nonswollen external anal hemorrhoids, small to medium in size. Rectal examination was normal with brown stool. This was Hemoccult negative.   Assessment and plan: 72 y.o. male with  minor rectal bleeding that was likely from small external hemorrhoids  I think it is safe that he resume his Plavix if it is necessary for cardiac, vascular disease standpoint. I will forward this note to his cardiologist. If he has recurrent bleeding after starting Plavix again then we can consider hemorrhoidal therapy, band ligation.

## 2013-06-26 NOTE — Patient Instructions (Addendum)
One of your biggest health concerns is your smoking.  This increases your risk for most cancers and serious cardiovascular diseases such as strokes, heart attacks.  You should try your best to stop.  If you need assistance, please contact your PCP or Smoking Cessation Class at [facility] [phone]) or [facility] Quit-Line (1-800-QUIT-NOW). OK to resume your plavix if your cardiologist feels it is necessary. If bleeding recurs, call here.

## 2013-08-12 ENCOUNTER — Other Ambulatory Visit: Payer: Self-pay | Admitting: *Deleted

## 2013-08-12 ENCOUNTER — Other Ambulatory Visit: Payer: Self-pay | Admitting: Cardiovascular Disease

## 2013-08-12 MED ORDER — ATORVASTATIN CALCIUM 20 MG PO TABS: 20.0000 mg | ORAL_TABLET | Freq: Every day | ORAL | Status: DC

## 2013-08-12 NOTE — Telephone Encounter (Signed)
Requested Prescriptions   Signed Prescriptions Disp Refills  . atorvastatin (LIPITOR) 20 MG tablet 30 tablet 6    Sig: Take 1 tablet (20 mg total) by mouth at bedtime.    Authorizing Provider: Antonieta Iba    Ordering User: Kendrick Fries

## 2013-08-19 ENCOUNTER — Other Ambulatory Visit: Payer: Self-pay | Admitting: *Deleted

## 2013-08-19 MED ORDER — HYDROCHLOROTHIAZIDE 25 MG PO TABS: 25.0000 mg | ORAL_TABLET | Freq: Every day | ORAL | Status: DC

## 2013-08-19 MED ORDER — CLOPIDOGREL BISULFATE 75 MG PO TABS: 75.0000 mg | ORAL_TABLET | Freq: Every day | ORAL | Status: DC

## 2013-08-19 MED ORDER — ATORVASTATIN CALCIUM 20 MG PO TABS: 20.0000 mg | ORAL_TABLET | Freq: Every day | ORAL | Status: DC

## 2013-08-19 NOTE — Telephone Encounter (Signed)
Requested Prescriptions   Signed Prescriptions Disp Refills  . atorvastatin (LIPITOR) 20 MG tablet 90 tablet 3    Sig: Take 1 tablet (20 mg total) by mouth at bedtime.    Authorizing Provider: Lorine Bears A    Ordering User: Shawnie Dapper, Vandy Tsuchiya C  . hydrochlorothiazide (HYDRODIURIL) 25 MG tablet 90 tablet 3    Sig: Take 1 tablet (25 mg total) by mouth daily.    Authorizing Provider: Lorine Bears A    Ordering User: Iverson Alamin C  . clopidogrel (PLAVIX) 75 MG tablet 90 tablet 3    Sig: Take 1 tablet (75 mg total) by mouth daily.    Authorizing Provider: Lorine Bears A    Ordering User: Kendrick Fries

## 2013-12-05 ENCOUNTER — Ambulatory Visit (INDEPENDENT_AMBULATORY_CARE_PROVIDER_SITE_OTHER): Payer: Medicare Other | Admitting: Cardiovascular Disease

## 2013-12-05 ENCOUNTER — Encounter (INDEPENDENT_AMBULATORY_CARE_PROVIDER_SITE_OTHER): Payer: Self-pay

## 2013-12-05 ENCOUNTER — Encounter: Payer: Self-pay | Admitting: Cardiovascular Disease

## 2013-12-05 VITALS — BP 162/80 | HR 80 | Ht 68.5 in | Wt 170.2 lb

## 2013-12-05 DIAGNOSIS — E785 Hyperlipidemia, unspecified: Secondary | ICD-10-CM | POA: Diagnosis not present

## 2013-12-05 DIAGNOSIS — I10 Essential (primary) hypertension: Secondary | ICD-10-CM

## 2013-12-05 DIAGNOSIS — I2510 Atherosclerotic heart disease of native coronary artery without angina pectoris: Secondary | ICD-10-CM | POA: Diagnosis not present

## 2013-12-05 DIAGNOSIS — I739 Peripheral vascular disease, unspecified: Secondary | ICD-10-CM

## 2013-12-05 MED ORDER — AMLODIPINE BESYLATE 5 MG PO TABS: 5.0000 mg | ORAL_TABLET | Freq: Every day | ORAL | Status: DC

## 2013-12-05 NOTE — Patient Instructions (Signed)
Your physician has recommended you make the following change in your medication:  Amlodipine 5 mg daily   Your physician has requested that you have an ankle brachial index (ABI). During this test an ultrasound and blood pressure cuff are used to evaluate the arteries that supply the arms and legs with blood. Allow thirty minutes for this exam. There are no restrictions or special instructions.  Your physician has requested that you have an abdominal aorta duplex. During this test, an ultrasound is used to evaluate the aorta. Allow 30 minutes for this exam. Do not eat after midnight the day before and avoid carbonated beverages

## 2013-12-05 NOTE — Assessment & Plan Note (Addendum)
He is doing reasonably well. He does significant bilateral SFA disease but does not complain of calf claudication. Thus, I recommend continuing medical therapy. He did have a stent in the right external iliac artery. He is due for a followup duplex and ABI. Continue dual antiplatelet therapy for now. I strongly advised him to quit smoking completely.

## 2013-12-05 NOTE — Progress Notes (Signed)
HPI  This is a 73 year old African American male who is here today for followup visit regarding peripheral arterial disease and slightly abnormal stress test..  He has prolonged history of tobacco , hypertension and hyperlipidemia. He was seen in may of 2014 for right hip and buttock discomfort with walking 75-100 yards. His ABI was significantly abnormal bilaterally. 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 I performed self-expanding stent placement to the right external iliac artery without complications.  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 Ejection fraction was 52%.  He had mild hemorrhoidal bleeding and was evaluated by Dr. Ardis Hughs. Initially aspirin and Plavix were stopped but these were resumed again with no further bleeding. He stopped taking amlodipine on his own as he thought that was the cause of bleeding. He denies chest pain or dyspnea. He does complain of mild right hip discomfort mostly at rest with no calf discomfort with walking.    No Known Allergies   Current Outpatient Prescriptions on File Prior to Visit  Medication Sig Dispense Refill  . aspirin 81 MG tablet Take 81 mg by mouth daily.      Marland Kitchen atorvastatin (LIPITOR) 20 MG tablet Take 1 tablet (20 mg total) by mouth at bedtime.  90 tablet  3  . clopidogrel (PLAVIX) 75 MG tablet Take 1 tablet (75 mg total) by mouth daily.  90 tablet  3  . hydrochlorothiazide (HYDRODIURIL) 25 MG tablet Take 1 tablet (25 mg total) by mouth daily.  90 tablet  3   No current facility-administered medications on file prior to visit.     Past Medical History  Diagnosis Date  . Arthritis   . Substance abuse   .  Peptic ulcer   . COPD (chronic obstructive pulmonary disease)   . ED (erectile dysfunction)   . Cervical disc disease   . Hyperlipidemia   . PAD (peripheral artery disease) 02/05/2013    05/14: Subtotally occluded right external iliac artery, long occlusion of the left proximal to mid SFA with 1 vessel runoff below the knee, diffusely diseased right SFA with short occlusion in the midsegment with three-vessel runoff below the knee. Status post self-expanding stent placement to the right external iliac artery.     Past Surgical History  Procedure Laterality Date  . Cervical disc surgery  1996  . Stomach surgery  1974    gastric ulcer  . Iliac artery stent      right stent placement  . [facility] bone marrow      Donor     Family History  Problem Relation Age of Onset  . Arthritis Sister   . Hypertension Neg Hx   . Cancer Neg Hx     colon cancer  . Heart disease Father   . Clotting disorder Mother     blod clot  . Leukemia Son     bone marrow transplant     History   Social History  . Marital Status: Married    Spouse Name: N/A    Number of Children: 7  . Years of Education: N/A   Occupational History  . retired Games developer     Social History Main Topics  . Smoking status: Current  Every Day Smoker -- 0.25 packs/day for 54 years    Types: Cigarettes  . Smokeless tobacco: Never Used  . Alcohol Use: Yes     Comment: rarely  . Drug Use: 7.00 per week    Special: Marijuana  . Sexual Activity: Not on file   Other Topics Concern  . Not on file   Social History Narrative   Tries to do exercises regularly (TV shows)   No living will   Requests that wife make medical decisions for him if he is unable   Would accept CPR. Would accept mechanical ventilation. Would accept feeding tube      PHYSICAL EXAM   BP 162/80  Pulse 80  Ht 5' 8.5" (1.74 m)  Wt 170 lb 4 oz (77.225 kg)  BMI 25.51 kg/m2 Constitutional: He is oriented to person, place, and time. He appears  well-developed and well-nourished. No distress.  HENT: No nasal discharge.  Head: Normocephalic and atraumatic.  Eyes: Pupils are equal and round. Right eye exhibits no discharge. Left eye exhibits no discharge.  Neck: Normal range of motion. Neck supple. No JVD present. No thyromegaly present.  Cardiovascular: Normal rate, regular rhythm, normal heart sounds and. Exam reveals no gallop and no friction rub. No murmur heard.  Pulmonary/Chest: Effort normal and breath sounds normal. No stridor. No respiratory distress. He has no wheezes. He has no rales. He exhibits no tenderness.  Abdominal: Soft. Bowel sounds are normal. He exhibits no distension. There is no tenderness. There is no rebound and no guarding.  Musculoskeletal: Normal range of motion. He exhibits no edema and no tenderness.  Neurological: He is alert and oriented to person, place, and time. Coordination normal.  Skin: Skin is warm and dry. No rash noted. He is not diaphoretic. No erythema. No pallor.  Psychiatric: He has a normal mood and affect. His behavior is normal. Judgment and thought content normal.  Vascular: Radial pulses normal bilaterally. Femoral pulse: Normal on the right side and mildly diminished on the left side. Distal pulses are not palpable     EKG: Sinus  Rhythm  -Right sided conduction defect and right axis -possible right ventricular hypertrophy or posterior fascicular block.   ABNORMAL   ASSESSMENT AND PLAN

## 2013-12-05 NOTE — Assessment & Plan Note (Signed)
I instructed him to resume amlodipine 5 mg once daily.

## 2013-12-05 NOTE — Assessment & Plan Note (Signed)
Lab Results  Component Value Date   CHOL 261* 01/16/2013   HDL 37* 04/01/2013   LDLCALC 124* 04/01/2013   LDLDIRECT 181.8 01/16/2013   TRIG 180* 04/01/2013   CHOLHDL 5.3* 04/01/2013   Continue treatment with atorvastatin. LDL last year was at target. I will plan on obtaining a followup lipid profile in 6 months.

## 2013-12-05 NOTE — Assessment & Plan Note (Signed)
He had a nuclear stress test last year which showed evidence of small inferior infarct without significant ischemia. Ejection fraction was 52%. Based on this, he likely has underlying coronary artery disease. His symptoms have improved with medical therapy and currently denies any angina. He is on good medical therapy at this point.

## 2013-12-06 ENCOUNTER — Ambulatory Visit: Payer: Medicare Other | Admitting: Cardiovascular Disease

## 2013-12-12 ENCOUNTER — Encounter (INDEPENDENT_AMBULATORY_CARE_PROVIDER_SITE_OTHER): Payer: Medicare Other

## 2013-12-12 DIAGNOSIS — I739 Peripheral vascular disease, unspecified: Secondary | ICD-10-CM | POA: Diagnosis not present

## 2013-12-12 DIAGNOSIS — I25810 Atherosclerosis of coronary artery bypass graft(s) without angina pectoris: Secondary | ICD-10-CM

## 2013-12-24 ENCOUNTER — Encounter: Payer: Self-pay | Admitting: Cardiovascular Disease

## 2013-12-24 ENCOUNTER — Ambulatory Visit (INDEPENDENT_AMBULATORY_CARE_PROVIDER_SITE_OTHER): Payer: Medicare Other | Admitting: Cardiovascular Disease

## 2013-12-24 VITALS — BP 154/82 | HR 98 | Ht 68.0 in | Wt 170.0 lb

## 2013-12-24 DIAGNOSIS — I2510 Atherosclerotic heart disease of native coronary artery without angina pectoris: Secondary | ICD-10-CM

## 2013-12-24 DIAGNOSIS — I10 Essential (primary) hypertension: Secondary | ICD-10-CM

## 2013-12-24 DIAGNOSIS — E785 Hyperlipidemia, unspecified: Secondary | ICD-10-CM

## 2013-12-24 DIAGNOSIS — I739 Peripheral vascular disease, unspecified: Secondary | ICD-10-CM

## 2013-12-24 NOTE — Patient Instructions (Signed)
Continue same medications.   Your physician wants you to follow-up in: 6 months.  You will receive a reminder letter in the mail two months in advance. If you don't receive a letter, please call our office to schedule the follow-up appointment.  

## 2013-12-24 NOTE — Assessment & Plan Note (Signed)
Continue treatment with atorvastatin. 

## 2013-12-24 NOTE — Assessment & Plan Note (Signed)
He had a nuclear stress test last year which showed evidence of small inferior infarct without significant ischemia. Ejection fraction was 52%. Based on this, he likely has underlying coronary artery disease. His symptoms have improved with medical therapy and currently denies any angina. He is on good medical therapy at this point.   

## 2013-12-24 NOTE — Progress Notes (Signed)
HPI  This is a 73 year old African American male who is here today for followup visit regarding peripheral arterial disease and slightly abnormal stress test..  He has prolonged history of tobacco , hypertension and hyperlipidemia. He was seen in may of 2014 for right hip and buttock discomfort with walking 75-100 yards. His ABI was significantly abnormal bilaterally. 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 I performed self-expanding stent placement to the right external iliac artery without complications.  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 Ejection fraction was 52%.  During last visit, he was noted to be hypertensive. I resumed amlodipine 5 mg once daily. He underwent noninvasive vascular evaluation which showed patent right external iliac artery stent with stable ABI bilaterally. He is doing well and denies claudication, chest pain or significant dyspnea.    No Known Allergies   Current Outpatient Prescriptions on File Prior to Visit  Medication Sig Dispense Refill  . amLODipine (NORVASC) 5 MG tablet Take 1 tablet (5 mg total) by mouth daily.  90 tablet  3  . aspirin 81 MG tablet Take 81 mg by mouth daily.      Marland Kitchen atorvastatin (LIPITOR) 20 MG tablet Take 1 tablet (20 mg total) by mouth at bedtime.  90 tablet  3  . clopidogrel (PLAVIX) 75 MG tablet Take 1 tablet (75 mg total) by mouth daily.  90 tablet  3  . hydrochlorothiazide (HYDRODIURIL) 25 MG tablet Take 1 tablet (25 mg total) by mouth daily.  90 tablet  3   No current facility-administered medications on file prior to visit.     Past Medical History  Diagnosis Date  . Arthritis   . Substance abuse    . Peptic ulcer   . COPD (chronic obstructive pulmonary disease)   . ED (erectile dysfunction)   . Cervical disc disease   . Hyperlipidemia   . PAD (peripheral artery disease) 02/05/2013    05/14: Subtotally occluded right external iliac artery, long occlusion of the left proximal to mid SFA with 1 vessel runoff below the knee, diffusely diseased right SFA with short occlusion in the midsegment with three-vessel runoff below the knee. Status post self-expanding stent placement to the right external iliac artery.     Past Surgical History  Procedure Laterality Date  . Cervical disc surgery  1996  . Stomach surgery  1974    gastric ulcer  . Iliac artery stent      right stent placement  . [facility] bone marrow      Donor     Family History  Problem Relation Age of Onset  . Arthritis Sister   . Hypertension Neg Hx   . Cancer Neg Hx     colon cancer  . Heart disease Father   . Clotting disorder Mother     blod clot  . Leukemia Son     bone marrow transplant     History   Social History  . Marital Status: Married    Spouse Name: N/A    Number of Children: 7  . Years of Education: N/A   Occupational History  . retired Games developer     Social History Main Topics  . Smoking status: Current  Every Day Smoker -- 0.25 packs/day for 54 years    Types: Cigarettes  . Smokeless tobacco: Never Used  . Alcohol Use: Yes     Comment: rarely  . Drug Use: No  . Sexual Activity: Not on file   Other Topics Concern  . Not on file   Social History Narrative   Tries to do exercises regularly (TV shows)   No living will   Requests that wife make medical decisions for him if he is unable   Would accept CPR. Would accept mechanical ventilation. Would accept feeding tube      PHYSICAL EXAM   BP 154/82  Pulse 98  Ht 5\' 8"  (1.727 m)  Wt 170 lb (77.111 kg)  BMI 25.85 kg/m2 Constitutional: He is oriented to person, place, and time. He appears well-developed and well-nourished. No  distress.  HENT: No nasal discharge.  Head: Normocephalic and atraumatic.  Eyes: Pupils are equal and round. Right eye exhibits no discharge. Left eye exhibits no discharge.  Neck: Normal range of motion. Neck supple. No JVD present. No thyromegaly present.  Cardiovascular: Normal rate, regular rhythm, normal heart sounds and. Exam reveals no gallop and no friction rub. No murmur heard.  Pulmonary/Chest: Effort normal and breath sounds normal. No stridor. No respiratory distress. He has no wheezes. He has no rales. He exhibits no tenderness.  Abdominal: Soft. Bowel sounds are normal. He exhibits no distension. There is no tenderness. There is no rebound and no guarding.  Musculoskeletal: Normal range of motion. He exhibits no edema and no tenderness.  Neurological: He is alert and oriented to person, place, and time. Coordination normal.  Skin: Skin is warm and dry. No rash noted. He is not diaphoretic. No erythema. No pallor.  Psychiatric: He has a normal mood and affect. His behavior is normal. Judgment and thought content normal.  Vascular: Radial pulses normal bilaterally. Femoral pulse: Normal on the right side and mildly diminished on the left side. Distal pulses are not palpable      ASSESSMENT AND PLAN

## 2013-12-24 NOTE — Assessment & Plan Note (Addendum)
He does have significant bilateral SFA disease but does not complain of calf claudication. Thus, I recommend continuing medical therapy. Recent arterial duplex showed patent stent in the right external iliac artery. Continue medical therapy. Recheck in one year.

## 2013-12-24 NOTE — Assessment & Plan Note (Signed)
Blood pressure improved after resuming amlodipine.

## 2014-01-17 ENCOUNTER — Encounter: Payer: Medicare Other | Admitting: Internal Medicine

## 2014-01-17 DIAGNOSIS — Z0289 Encounter for other administrative examinations: Secondary | ICD-10-CM

## 2014-01-18 ENCOUNTER — Other Ambulatory Visit: Payer: Self-pay | Admitting: Cardiovascular Disease

## 2014-02-24 ENCOUNTER — Other Ambulatory Visit: Payer: Self-pay | Admitting: Cardiovascular Disease

## 2014-05-30 ENCOUNTER — Encounter: Payer: Self-pay | Admitting: Gastroenterology

## 2014-06-23 ENCOUNTER — Encounter: Payer: Self-pay | Admitting: Cardiovascular Disease

## 2014-06-23 ENCOUNTER — Ambulatory Visit (INDEPENDENT_AMBULATORY_CARE_PROVIDER_SITE_OTHER): Payer: Medicare Other | Admitting: Cardiovascular Disease

## 2014-06-23 ENCOUNTER — Ambulatory Visit: Payer: Self-pay | Admitting: Cardiovascular Disease

## 2014-06-23 ENCOUNTER — Other Ambulatory Visit: Payer: Self-pay | Admitting: *Deleted

## 2014-06-23 ENCOUNTER — Encounter (INDEPENDENT_AMBULATORY_CARE_PROVIDER_SITE_OTHER): Payer: Self-pay

## 2014-06-23 VITALS — BP 140/70 | HR 74 | Ht 68.0 in | Wt 162.8 lb

## 2014-06-23 DIAGNOSIS — I2510 Atherosclerotic heart disease of native coronary artery without angina pectoris: Secondary | ICD-10-CM

## 2014-06-23 DIAGNOSIS — I739 Peripheral vascular disease, unspecified: Secondary | ICD-10-CM | POA: Diagnosis not present

## 2014-06-23 DIAGNOSIS — I10 Essential (primary) hypertension: Secondary | ICD-10-CM

## 2014-06-23 DIAGNOSIS — E785 Hyperlipidemia, unspecified: Secondary | ICD-10-CM | POA: Diagnosis not present

## 2014-06-23 LAB — LIPID PANEL
Cholesterol: 156 mg/dL (ref 0–200)
HDL: 37 mg/dL — AB (ref 40–60)
LDL CHOLESTEROL, CALC: 92 mg/dL (ref 0–100)
Triglycerides: 134 mg/dL (ref 0–200)
VLDL Cholesterol, Calc: 27 mg/dL (ref 5–40)

## 2014-06-23 LAB — HEPATIC FUNCTION PANEL A (ARMC)
ALBUMIN: 3.9 g/dL (ref 3.4–5.0)
Alkaline Phosphatase: 113 U/L
BILIRUBIN DIRECT: 0.1 mg/dL (ref 0.00–0.20)
BILIRUBIN TOTAL: 0.4 mg/dL (ref 0.2–1.0)
SGOT(AST): 19 U/L (ref 15–37)
SGPT (ALT): 19 U/L
TOTAL PROTEIN: 7.6 g/dL (ref 6.4–8.2)

## 2014-06-23 MED ORDER — ATORVASTATIN CALCIUM 20 MG PO TABS: ORAL_TABLET | ORAL | Status: DC

## 2014-06-23 NOTE — Assessment & Plan Note (Signed)
He reports no claudication at the present time. He has bilateral SFA disease which is being treated medically.

## 2014-06-23 NOTE — Assessment & Plan Note (Signed)
He is overall doing well with no symptoms suggestive of angina. Continue medical therapy.   

## 2014-06-23 NOTE — Patient Instructions (Signed)
Your physician recommends that you have labs today:  Lipid and Liver panel   Your physician recommends that you continue on your current medications as directed. Please refer to the Current Medication list given to you today.  Your physician wants you to follow-up in: 6 months with Dr. Fletcher Anon. You will receive a reminder letter in the mail two months in advance. If you don't receive a letter, please call our office to schedule the follow-up appointment.

## 2014-06-23 NOTE — Assessment & Plan Note (Signed)
Blood pressure is now reasonably controlled on current medications.

## 2014-06-23 NOTE — Progress Notes (Signed)
HPI  This is a 73 year old African American male who is here today for followup visit regarding peripheral arterial disease and slightly abnormal stress test..  He has prolonged history of tobacco , hypertension and hyperlipidemia. He was seen in May of 2014 for right hip and buttock discomfort with walking 75-100 yards. His ABI was significantly abnormal bilaterally. 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 I performed self-expanding stent placement to the right external iliac artery without complications.  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 Ejection fraction was 52%. He was treated medically given improvement in symptoms with medical therapy. He reports feeling very well today with no chest pain or significant dyspnea. He denies claudication. He continues to smoke unfortunately.   No Known Allergies   Current Outpatient Prescriptions on File Prior to Visit  Medication Sig Dispense Refill  . amLODipine (NORVASC) 5 MG tablet Take 1 tablet (5 mg total) by mouth daily.  90 tablet  3  . aspirin 81 MG tablet Take 81 mg by mouth daily.      Marland Kitchen atorvastatin (LIPITOR) 20 MG tablet TAKE 1 TABLET (20 MG TOTAL) BY MOUTH AT BEDTIME.  30 tablet  3  . clopidogrel (PLAVIX) 75 MG tablet Take 1 tablet (75 mg total) by mouth daily.  90 tablet  3  . hydrochlorothiazide (HYDRODIURIL) 25 MG tablet Take 1 tablet (25 mg total) by mouth daily.  90 tablet  3   No current facility-administered medications on file prior to visit.     Past Medical History  Diagnosis Date  . Arthritis   . Substance abuse   . Peptic ulcer   . COPD (chronic obstructive pulmonary disease)   . ED  (erectile dysfunction)   . Cervical disc disease   . Hyperlipidemia   . PAD (peripheral artery disease) 02/05/2013    05/14: Subtotally occluded right external iliac artery, long occlusion of the left proximal to mid SFA with 1 vessel runoff below the knee, diffusely diseased right SFA with short occlusion in the midsegment with three-vessel runoff below the knee. Status post self-expanding stent placement to the right external iliac artery.     Past Surgical History  Procedure Laterality Date  . Cervical disc surgery  1996  . Stomach surgery  1974    gastric ulcer  . Iliac artery stent      right stent placement  . [facility] bone marrow      Donor     Family History  Problem Relation Age of Onset  . Arthritis Sister   . Hypertension Neg Hx   . Cancer Neg Hx     colon cancer  . Heart disease Father   . Clotting disorder Mother     blod clot  . Leukemia Son     bone marrow transplant     History   Social History  . Marital Status: Married    Spouse Name: N/A    Number of Children: 7  . Years of Education: N/A   Occupational History  . retired Games developer     Social History Main Topics  . Smoking status: Current Every Day Smoker -- 0.25 packs/day for 54 years    Types: Cigarettes  .  Smokeless tobacco: Never Used  . Alcohol Use: No     Comment: rarely  . Drug Use: No  . Sexual Activity: Not on file   Other Topics Concern  . Not on file   Social History Narrative   Tries to do exercises regularly (TV shows)   No living will   Requests that wife make medical decisions for him if he is unable   Would accept CPR. Would accept mechanical ventilation. Would accept feeding tube      PHYSICAL EXAM   BP 140/70  Pulse 74  Ht 5\' 8"  (1.727 m)  Wt 162 lb 12 oz (73.823 kg)  BMI 24.75 kg/m2 Constitutional: He is oriented to person, place, and time. He appears well-developed and well-nourished. No distress.  HENT: No nasal discharge.  Head: Normocephalic and  atraumatic.  Eyes: Pupils are equal and round. Right eye exhibits no discharge. Left eye exhibits no discharge.  Neck: Normal range of motion. Neck supple. No JVD present. No thyromegaly present.  Cardiovascular: Normal rate, regular rhythm, normal heart sounds and. Exam reveals no gallop and no friction rub. No murmur heard.  Pulmonary/Chest: Effort normal and breath sounds normal. No stridor. No respiratory distress. He has no wheezes. He has no rales. He exhibits no tenderness.  Abdominal: Soft. Bowel sounds are normal. He exhibits no distension. There is no tenderness. There is no rebound and no guarding.  Musculoskeletal: Normal range of motion. He exhibits no edema and no tenderness.  Neurological: He is alert and oriented to person, place, and time. Coordination normal.  Skin: Skin is warm and dry. No rash noted. He is not diaphoretic. No erythema. No pallor.  Psychiatric: He has a normal mood and affect. His behavior is normal. Judgment and thought content normal.  Vascular: Radial pulses normal bilaterally. Femoral pulse: Normal on the right side and mildly diminished on the left side. Distal pulses are not palpable    EKG: Sinus  Rhythm  -Left axis -anterior fascicular block.   Low voltage with rightward P-axis and rotation -possible pulmonary disease.   ABNORMAL   ASSESSMENT AND PLAN

## 2014-06-23 NOTE — Assessment & Plan Note (Signed)
Check fasting lipid and liver profile today. Continue atorvastatin. 

## 2014-08-01 ENCOUNTER — Encounter: Payer: Self-pay | Admitting: Internal Medicine

## 2014-08-01 ENCOUNTER — Ambulatory Visit (INDEPENDENT_AMBULATORY_CARE_PROVIDER_SITE_OTHER): Payer: Medicare Other | Admitting: Internal Medicine

## 2014-08-01 VITALS — BP 142/70 | HR 82 | Temp 98.4°F | Ht 68.0 in | Wt 163.0 lb

## 2014-08-01 DIAGNOSIS — Z7189 Other specified counseling: Secondary | ICD-10-CM | POA: Insufficient documentation

## 2014-08-01 DIAGNOSIS — J449 Chronic obstructive pulmonary disease, unspecified: Secondary | ICD-10-CM | POA: Diagnosis not present

## 2014-08-01 DIAGNOSIS — E785 Hyperlipidemia, unspecified: Secondary | ICD-10-CM

## 2014-08-01 DIAGNOSIS — I739 Peripheral vascular disease, unspecified: Secondary | ICD-10-CM

## 2014-08-01 DIAGNOSIS — I2510 Atherosclerotic heart disease of native coronary artery without angina pectoris: Secondary | ICD-10-CM | POA: Diagnosis not present

## 2014-08-01 DIAGNOSIS — Z0000 Encounter for general adult medical examination without abnormal findings: Secondary | ICD-10-CM | POA: Diagnosis not present

## 2014-08-01 DIAGNOSIS — I10 Essential (primary) hypertension: Secondary | ICD-10-CM

## 2014-08-01 MED ORDER — SILDENAFIL CITRATE 20 MG PO TABS: 60.0000 mg | ORAL_TABLET | Freq: Every day | ORAL | Status: DC | PRN

## 2014-08-01 NOTE — Patient Instructions (Signed)
Please call 1-800-QUIT NOW for help in giving up the rest of the cigarettes.

## 2014-08-01 NOTE — Assessment & Plan Note (Signed)
Has cut down on cigarettes No major symptoms so Rx not needed yet

## 2014-08-01 NOTE — Progress Notes (Signed)
Pre visit review using our clinic review tool, if applicable. No additional management support is needed unless otherwise documented below in the visit note. 

## 2014-08-01 NOTE — Assessment & Plan Note (Signed)
See social history 

## 2014-08-01 NOTE — Assessment & Plan Note (Signed)
On statin Recent labs okay

## 2014-08-01 NOTE — Assessment & Plan Note (Signed)
I have personally reviewed the Medicare Annual Wellness questionnaire and have noted 1. The patient's medical and social history 2. Their use of alcohol, tobacco or illicit drugs 3. Their current medications and supplements 4. The patient's functional ability including ADL's, fall risks, home safety risks and hearing or visual             impairment. 5. Diet and physical activities 6. Evidence for depression or mood disorders  The patients weight, height, BMI and visual acuity have been recorded in the chart I have made referrals, counseling and provided education to the patient based review of the above and I have provided the pt with a written personalized care plan for preventive services.  I have provided you with a copy of your personalized plan for preventive services. Please take the time to review along with your updated medication list.  No immunizations--his decision No PSA testing due to age UTD on colonoscopy--not sure another will be indicated Counseled on cigarette cessation

## 2014-08-01 NOTE — Progress Notes (Signed)
Subjective:    Patient ID: Sean [name], male    DOB: 08/12/1941, 73 y.o.   MRN: [id number]  HPI Here for Medicare wellness Reviewed his form and advanced directives Only sees Dr Arida--reviewed intervention for PAD and stress test, etc  Has cut down cigarettes to 1/2 PPD. Not ready to stop altogether though. Rare vodka Independent with instrumental ADLs Vision and hearing are okay Independent in instrumental ADLs No cognitive problems No falls no depression or anhedonia  No foot pain Tries to exercise on elliptical machine and walks No foot pain with exercise No chest pain No SOB No dizziness or syncope No edema  No problems with arthritis lately Aching knees are not an issue anymore Rarely will have discomfort in neck at night--- just can reposition  No myalgias or other problems with the statin  Current Outpatient Prescriptions on File Prior to Visit  Medication Sig Dispense Refill  . amLODipine (NORVASC) 5 MG tablet Take 1 tablet (5 mg total) by mouth daily. 90 tablet 3  . aspirin 81 MG tablet Take 81 mg by mouth daily.    Marland Kitchen atorvastatin (LIPITOR) 20 MG tablet TAKE 1 TABLET (20 MG TOTAL) BY MOUTH AT BEDTIME. 90 tablet 3  . clopidogrel (PLAVIX) 75 MG tablet Take 1 tablet (75 mg total) by mouth daily. 90 tablet 3  . hydrochlorothiazide (HYDRODIURIL) 25 MG tablet Take 1 tablet (25 mg total) by mouth daily. 90 tablet 3   No current facility-administered medications on file prior to visit.    No Known Allergies  Past Medical History  Diagnosis Date  . Arthritis   . Substance abuse   . Peptic ulcer   . COPD (chronic obstructive pulmonary disease)   . ED (erectile dysfunction)   . Cervical disc disease   . Hyperlipidemia   . PAD (peripheral artery disease) 02/05/2013    05/14: Subtotally occluded right external iliac artery, long occlusion of the left proximal to mid SFA with 1 vessel runoff below the knee, diffusely diseased right SFA with short occlusion in  the midsegment with three-vessel runoff below the knee. Status post self-expanding stent placement to the right external iliac artery.    Past Surgical History  Procedure Laterality Date  . Cervical disc surgery  1996  . Stomach surgery  1974    gastric ulcer  . Iliac artery stent      right stent placement  . Harvest bone marrow      Donor    Family History  Problem Relation Age of Onset  . Arthritis Sister   . Hypertension Neg Hx   . Cancer Neg Hx     colon cancer  . Heart disease Father   . Clotting disorder Mother     blod clot  . Leukemia Son     bone marrow transplant    History   Social History  . Marital Status: Married    Spouse Name: N/A    Number of Children: 7  . Years of Education: N/A   Occupational History  . retired Games developer     Social History Main Topics  . Smoking status: Current Every Day Smoker -- 0.25 packs/day for 54 years    Types: Cigarettes  . Smokeless tobacco: Never Used  . Alcohol Use: No     Comment: rarely  . Drug Use: No  . Sexual Activity: Not on file   Other Topics Concern  . Not on file   Social History Narrative   No living  will   Requests that wife make medical decisions for him if he is unable   Would accept CPR.    Would accept mechanical ventilation.    Would accept feeding tube   Review of Systems Appetite is fine Has lost some weight this year-- down 7# Sleeps okay in general Bowels are fine No voiding problems but does have some urgency Libido way down--some ED. Cialis helped but too expensive    Objective:   Physical Exam  Constitutional: He is oriented to person, place, and time. He appears well-developed and well-nourished. No distress.  HENT:  Mouth/Throat: Oropharynx is clear and moist. No oropharyngeal exudate.  Full dentures  Neck: Normal range of motion. Neck supple. No thyromegaly present.  Cardiovascular: Normal rate, regular rhythm and normal heart sounds.  Exam reveals no gallop.   No  murmur heard. Faint pedal pulses  Pulmonary/Chest: Effort normal and breath sounds normal. No respiratory distress. He has no wheezes. He has no rales.  Abdominal: Soft. There is no tenderness.  Musculoskeletal: He exhibits no edema or tenderness.  Lymphadenopathy:    He has no cervical adenopathy.  Neurological: He is alert and oriented to person, place, and time.  President-- "Obama, Bush, Clinton" [phone] Can't spell Recall 3/3  Skin: No rash noted.  Psychiatric: He has a normal mood and affect. His behavior is normal.          Assessment & Plan:

## 2014-08-01 NOTE — Assessment & Plan Note (Signed)
On secondary prevention with statin and ASA BP controlled

## 2014-08-01 NOTE — Assessment & Plan Note (Signed)
Better since stent Follows with Dr Fletcher Anon

## 2014-08-01 NOTE — Assessment & Plan Note (Signed)
BP Readings from Last 3 Encounters:  08/01/14 142/70  06/23/14 140/70  12/24/13 154/82  reasonable control

## 2014-08-04 ENCOUNTER — Telehealth: Payer: Self-pay | Admitting: Internal Medicine

## 2014-08-04 NOTE — Telephone Encounter (Signed)
emmi mailed  °

## 2014-08-05 ENCOUNTER — Telehealth: Payer: Self-pay | Admitting: Internal Medicine

## 2014-08-05 NOTE — Telephone Encounter (Signed)
emmi mailed  °

## 2014-08-21 ENCOUNTER — Encounter (HOSPITAL_COMMUNITY): Payer: Self-pay | Admitting: Cardiovascular Disease

## 2014-10-15 ENCOUNTER — Other Ambulatory Visit: Payer: Self-pay | Admitting: Cardiovascular Disease

## 2014-11-29 ENCOUNTER — Other Ambulatory Visit: Payer: Self-pay | Admitting: Cardiovascular Disease

## 2014-12-01 ENCOUNTER — Telehealth: Payer: Self-pay | Admitting: *Deleted

## 2014-12-01 NOTE — Telephone Encounter (Signed)
Called patient to check if he already received his flu vaccine or not. Left voicemail for patient to call back.

## 2014-12-29 ENCOUNTER — Ambulatory Visit (INDEPENDENT_AMBULATORY_CARE_PROVIDER_SITE_OTHER): Payer: Medicare Other | Admitting: Cardiovascular Disease

## 2014-12-29 ENCOUNTER — Encounter: Payer: Self-pay | Admitting: Cardiovascular Disease

## 2014-12-29 VITALS — BP 142/72 | HR 75 | Ht 68.5 in | Wt 167.8 lb

## 2014-12-29 DIAGNOSIS — I10 Essential (primary) hypertension: Secondary | ICD-10-CM | POA: Diagnosis not present

## 2014-12-29 DIAGNOSIS — Z720 Tobacco use: Secondary | ICD-10-CM

## 2014-12-29 DIAGNOSIS — E785 Hyperlipidemia, unspecified: Secondary | ICD-10-CM | POA: Diagnosis not present

## 2014-12-29 DIAGNOSIS — I2510 Atherosclerotic heart disease of native coronary artery without angina pectoris: Secondary | ICD-10-CM | POA: Diagnosis not present

## 2014-12-29 DIAGNOSIS — I739 Peripheral vascular disease, unspecified: Secondary | ICD-10-CM

## 2014-12-29 NOTE — Progress Notes (Signed)
HPI  This is a 74 year old African American male who is here today for followup visit regarding peripheral arterial disease and slightly abnormal stress test..  He has prolonged history of tobacco , hypertension and hyperlipidemia. He was seen in May of 2014 for right hip and buttock discomfort with walking 75-100 yards. His ABI was significantly abnormal bilaterally. 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 I performed self-expanding stent placement to the right external iliac artery without complications.  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 Ejection fraction was 52%. He was treated medically given improvement in symptoms with medical therapy. He reports feeling very well today with no chest pain or significant dyspnea. He denies claudication. He continues to smoke unfortunately. He has no desire to quit.    No Known Allergies   Current Outpatient Prescriptions on File Prior to Visit  Medication Sig Dispense Refill  . amLODipine (NORVASC) 5 MG tablet TAKE 1 TABLET (5 MG TOTAL) BY MOUTH DAILY. 90 tablet 3  . aspirin 81 MG tablet Take 81 mg by mouth daily.    Marland Kitchen atorvastatin (LIPITOR) 20 MG tablet TAKE 1 TABLET (20 MG TOTAL) BY MOUTH AT BEDTIME. 90 tablet 3  . clopidogrel (PLAVIX) 75 MG tablet TAKE 1 TABLET (75 MG TOTAL) BY MOUTH DAILY. 90 tablet 3  . hydrochlorothiazide (HYDRODIURIL) 25 MG tablet TAKE 1 TABLET (25 MG TOTAL) BY MOUTH DAILY. 90 tablet 3  . sildenafil (REVATIO) 20 MG tablet Take 3-5 tablets (60-100 mg total) by mouth daily as needed. 50 tablet 11   No current facility-administered medications on file prior to visit.     Past Medical History    Diagnosis Date  . Arthritis   . Substance abuse   . Peptic ulcer   . COPD (chronic obstructive pulmonary disease)   . ED (erectile dysfunction)   . Cervical disc disease   . Hyperlipidemia   . PAD (peripheral artery disease) 02/05/2013    05/14: Subtotally occluded right external iliac artery, long occlusion of the left proximal to mid SFA with 1 vessel runoff below the knee, diffusely diseased right SFA with short occlusion in the midsegment with three-vessel runoff below the knee. Status post self-expanding stent placement to the right external iliac artery.     Past Surgical History  Procedure Laterality Date  . Cervical disc surgery  1996  . Stomach surgery  1974    gastric ulcer  . Iliac artery stent      right stent placement  . Harvest bone marrow      Donor  . Abdominal aortagram N/A 02/13/2013    Procedure: ABDOMINAL Maxcine Ham;  Surgeon: Wellington Hampshire, MD;  Location: [facility] CATH LAB;  Service: Cardiovascular;  Laterality: N/A;  . Percutaneous stent intervention Right 02/13/2013    Procedure: PERCUTANEOUS STENT INTERVENTION;  Surgeon: Wellington Hampshire, MD;  Location: [facility] CATH LAB;  Service: Cardiovascular;  Laterality: Right;  rt ext iliac stent  . Lower extremity angiogram  02/13/2013    Procedure: LOWER EXTREMITY ANGIOGRAM;  Surgeon: Wellington Hampshire, MD;  Location: [facility] CATH LAB;  Service: Cardiovascular;;     Family History  Problem Relation Age of Onset  . Arthritis Sister   .  Hypertension Neg Hx   . Cancer Neg Hx     colon cancer  . Heart disease Father   . Clotting disorder Mother     blod clot  . Leukemia Son     bone marrow transplant     History   Social History  . Marital Status: Married    Spouse Name: N/A  . Number of Children: 7  . Years of Education: N/A   Occupational History  . retired Games developer     Social History Main Topics  . Smoking status: Current Every Day Smoker -- 0.25 packs/day for 54 years    Types: Cigarettes  . Smokeless tobacco:  Never Used  . Alcohol Use: No     Comment: rarely  . Drug Use: No  . Sexual Activity: Not on file   Other Topics Concern  . Not on file   Social History Narrative   No living will   Requests that wife make medical decisions for him if he is unable   Would accept CPR.    Would accept mechanical ventilation.    Would accept feeding tube      PHYSICAL EXAM   BP 142/72 mmHg  Pulse 75  Ht 5' 8.5" (1.74 m)  Wt 167 lb 12 oz (76.091 kg)  BMI 25.13 kg/m2 Constitutional: He is oriented to person, place, and time. He appears well-developed and well-nourished. No distress.  HENT: No nasal discharge.  Head: Normocephalic and atraumatic.  Eyes: Pupils are equal and round. Right eye exhibits no discharge. Left eye exhibits no discharge.  Neck: Normal range of motion. Neck supple. No JVD present. No thyromegaly present.  Cardiovascular: Normal rate, regular rhythm, normal heart sounds and. Exam reveals no gallop and no friction rub. No murmur heard.  Pulmonary/Chest: Effort normal and breath sounds normal. No stridor. No respiratory distress. He has no wheezes. He has no rales. He exhibits no tenderness.  Abdominal: Soft. Bowel sounds are normal. He exhibits no distension. There is no tenderness. There is no rebound and no guarding.  Musculoskeletal: Normal range of motion. He exhibits no edema and no tenderness.  Neurological: He is alert and oriented to person, place, and time. Coordination normal.  Skin: Skin is warm and dry. No rash noted. He is not diaphoretic. No erythema. No pallor.  Psychiatric: He has a normal mood and affect. His behavior is normal. Judgment and thought content normal.  Vascular: Radial pulses normal bilaterally. Femoral pulse: Normal on the right side and mildly diminished on the left side. Distal pulses are not palpable    EKG: Sinus  Rhythm  -Left axis -anterior fascicular block.   ABNORMAL   ASSESSMENT AND PLAN

## 2014-12-29 NOTE — Patient Instructions (Signed)
Medication Instructions:  None   Labwork: None  Testing/Procedures: Your physician has requested that you have an ankle brachial index (ABI). During this test an ultrasound and blood pressure cuff are used to evaluate the arteries that supply the arms and legs with blood. Allow thirty minutes for this exam. There are no restrictions or special instructions.  Aortic Iliac Duplex test  Follow-Up: Your physician wants you to follow-up in: 6 months with Dr. Fletcher Anon. You will receive a reminder letter in the mail two months in advance. If you don't receive a letter, please call our office to schedule the follow-up appointment.   Any Other Special Instructions Will Be Listed Below (If Applicable).

## 2015-01-03 NOTE — Assessment & Plan Note (Signed)
Continue treatment with atorvastatin. He will need a follow-up lipid and liver profile in the near future.

## 2015-01-03 NOTE — Assessment & Plan Note (Signed)
Blood pressure is now reasonably controlled on current medications.

## 2015-01-03 NOTE — Assessment & Plan Note (Signed)
He denies any claudication. I requested a follow-up aortoiliac duplex.

## 2015-01-03 NOTE — Assessment & Plan Note (Signed)
He is overall doing well with no symptoms suggestive of angina. Continue medical therapy.

## 2015-01-03 NOTE — Assessment & Plan Note (Signed)
Unfortunately, he has no desire to quit smoking.

## 2015-01-12 ENCOUNTER — Other Ambulatory Visit: Payer: Self-pay

## 2015-01-15 ENCOUNTER — Other Ambulatory Visit: Payer: Self-pay | Admitting: Cardiovascular Disease

## 2015-01-15 DIAGNOSIS — I739 Peripheral vascular disease, unspecified: Secondary | ICD-10-CM

## 2015-01-22 ENCOUNTER — Ambulatory Visit (INDEPENDENT_AMBULATORY_CARE_PROVIDER_SITE_OTHER): Payer: Medicare Other

## 2015-01-22 DIAGNOSIS — I739 Peripheral vascular disease, unspecified: Secondary | ICD-10-CM

## 2015-06-16 ENCOUNTER — Other Ambulatory Visit: Payer: Self-pay | Admitting: *Deleted

## 2015-06-16 MED ORDER — ATORVASTATIN CALCIUM 20 MG PO TABS: ORAL_TABLET | ORAL | Status: DC

## 2015-08-03 ENCOUNTER — Encounter: Payer: Self-pay | Admitting: Internal Medicine

## 2015-08-03 ENCOUNTER — Ambulatory Visit (INDEPENDENT_AMBULATORY_CARE_PROVIDER_SITE_OTHER): Payer: Medicare Other | Admitting: Internal Medicine

## 2015-08-03 VITALS — BP 140/70 | HR 83 | Temp 97.7°F | Ht 67.25 in | Wt 165.0 lb

## 2015-08-03 DIAGNOSIS — Z0000 Encounter for general adult medical examination without abnormal findings: Secondary | ICD-10-CM

## 2015-08-03 DIAGNOSIS — I2510 Atherosclerotic heart disease of native coronary artery without angina pectoris: Secondary | ICD-10-CM

## 2015-08-03 DIAGNOSIS — I739 Peripheral vascular disease, unspecified: Secondary | ICD-10-CM | POA: Diagnosis not present

## 2015-08-03 DIAGNOSIS — M5030 Other cervical disc degeneration, unspecified cervical region: Secondary | ICD-10-CM

## 2015-08-03 DIAGNOSIS — J438 Other emphysema: Secondary | ICD-10-CM | POA: Diagnosis not present

## 2015-08-03 DIAGNOSIS — Z7189 Other specified counseling: Secondary | ICD-10-CM

## 2015-08-03 LAB — COMPREHENSIVE METABOLIC PANEL
ALBUMIN: 4.2 g/dL (ref 3.5–5.2)
ALT: 12 U/L (ref 0–53)
AST: 14 U/L (ref 0–37)
Alkaline Phosphatase: 96 U/L (ref 39–117)
BUN: 21 mg/dL (ref 6–23)
CALCIUM: 9.8 mg/dL (ref 8.4–10.5)
CO2: 29 mEq/L (ref 19–32)
Chloride: 102 mEq/L (ref 96–112)
Creatinine, Ser: 1.37 mg/dL (ref 0.40–1.50)
GFR: 65.49 mL/min (ref >60.00)
Glucose, Bld: 86 mg/dL (ref 70–99)
Potassium: 4.1 mEq/L (ref 3.5–5.1)
Sodium: 138 mEq/L (ref 135–145)
Total Bilirubin: 0.5 mg/dL (ref 0.2–1.2)
Total Protein: 7.1 g/dL (ref 6.0–8.3)

## 2015-08-03 LAB — LIPID PANEL
CHOL/HDL RATIO: 4
Cholesterol: 145 mg/dL (ref 0–200)
HDL: 33.7 mg/dL — AB (ref >39.00)
LDL Cholesterol: 97 mg/dL (ref 0–99)
NONHDL: 111.5
TRIGLYCERIDES: 74 mg/dL (ref 0.0–149.0)
VLDL: 14.8 mg/dL (ref 0.0–40.0)

## 2015-08-03 NOTE — Assessment & Plan Note (Signed)
Stable blockages without symptoms No action for now Gets yearly cardiology evals

## 2015-08-03 NOTE — Assessment & Plan Note (Signed)
See social history Prefers not to do formal advanced directives

## 2015-08-03 NOTE — Assessment & Plan Note (Signed)
I have personally reviewed the Medicare Annual Wellness questionnaire and have noted 1. The patient's medical and social history 2. Their use of alcohol, tobacco or illicit drugs 3. Their current medications and supplements 4. The patient's functional ability including ADL's, fall risks, home safety risks and hearing or visual             impairment. 5. Diet and physical activities 6. Evidence for depression or mood disorders  The patients weight, height, BMI and visual acuity have been recorded in the chart I have made referrals, counseling and provided education to the patient based review of the above and I have provided the pt with a written personalized care plan for preventive services.  I have provided you with a copy of your personalized plan for preventive services. Please take the time to review along with your updated medication list.  He declines flu and pneumonia vaccines No PSA due to age Colon due again 2018 if health makes it appropriate

## 2015-08-03 NOTE — Assessment & Plan Note (Signed)
Chronic left shoulder and arm symptoms Not enough to need action

## 2015-08-03 NOTE — Assessment & Plan Note (Signed)
Mild without sig symptoms Unfortunately not willing to stop smoking

## 2015-08-03 NOTE — Progress Notes (Signed)
Pre visit review using our clinic review tool, if applicable. No additional management support is needed unless otherwise documented below in the visit note. 

## 2015-08-03 NOTE — Progress Notes (Signed)
Subjective:    Patient ID: Sean [name], male    DOB: 1941/01/20, 74 y.o.   MRN: [id number]  HPI Here for Medicare wellness and follow up of chronic medical conditions Reviewed form and advanced directives Reviewed other physicians. Dr Arida--cardiology. No dentist. No recent eye exams Vision is good Hearing is fine Still smoking-- he wants to continue but he has cut down. No alcohol He tries to exercise on the elliptical regularly No falls No depression but occasionally feels lack of interest No apparent cognitive problems Independent with instrumental ADLs  He has no new concerns No chest pain No SOB--- no change in exercise tolerance No dizziness or syncope No edema He doesn't get any leg pain with exercise. No sores or open areas on legs  No cough No breathing problems  No myalgia on statin No GI problems No upset stomach  Current Outpatient Prescriptions on File Prior to Visit  Medication Sig Dispense Refill  . amLODipine (NORVASC) 5 MG tablet TAKE 1 TABLET (5 MG TOTAL) BY MOUTH DAILY. 90 tablet 3  . aspirin 81 MG tablet Take 81 mg by mouth daily.    Marland Kitchen atorvastatin (LIPITOR) 20 MG tablet TAKE 1 TABLET (20 MG TOTAL) BY MOUTH AT BEDTIME. 90 tablet 3  . clopidogrel (PLAVIX) 75 MG tablet TAKE 1 TABLET (75 MG TOTAL) BY MOUTH DAILY. 90 tablet 3  . hydrochlorothiazide (HYDRODIURIL) 25 MG tablet TAKE 1 TABLET (25 MG TOTAL) BY MOUTH DAILY. 90 tablet 3  . sildenafil (REVATIO) 20 MG tablet Take 3-5 tablets (60-100 mg total) by mouth daily as needed. 50 tablet 11   No current facility-administered medications on file prior to visit.    No Known Allergies  Past Medical History  Diagnosis Date  . Arthritis   . Substance abuse   . Peptic ulcer   . COPD (chronic obstructive pulmonary disease) ([place])   . ED (erectile dysfunction)   . Cervical disc disease   . Hyperlipidemia   . PAD (peripheral artery disease) ([place]) 02/05/2013    05/14: Subtotally occluded right  external iliac artery, long occlusion of the left proximal to mid SFA with 1 vessel runoff below the knee, diffusely diseased right SFA with short occlusion in the midsegment with three-vessel runoff below the knee. Status post self-expanding stent placement to the right external iliac artery.    Past Surgical History  Procedure Laterality Date  . Cervical disc surgery  1996  . Stomach surgery  1974    gastric ulcer  . Iliac artery stent      right stent placement  . Harvest bone marrow      Donor  . Abdominal aortagram N/A 02/13/2013    Procedure: ABDOMINAL Maxcine Ham;  Surgeon: Wellington Hampshire, MD;  Location: [facility] CATH LAB;  Service: Cardiovascular;  Laterality: N/A;  . Percutaneous stent intervention Right 02/13/2013    Procedure: PERCUTANEOUS STENT INTERVENTION;  Surgeon: Wellington Hampshire, MD;  Location: [place] CATH LAB;  Service: Cardiovascular;  Laterality: Right;  rt ext iliac stent  . Lower extremity angiogram  02/13/2013    Procedure: LOWER EXTREMITY ANGIOGRAM;  Surgeon: Wellington Hampshire, MD;  Location: [facility] CATH LAB;  Service: Cardiovascular;;    Family History  Problem Relation Age of Onset  . Arthritis Sister   . Hypertension Neg Hx   . Cancer Neg Hx     colon cancer  . Heart disease Father   . Clotting disorder Mother     blod clot  . Leukemia Son  bone marrow transplant    Social History   Social History  . Marital Status: Married    Spouse Name: N/A  . Number of Children: 7  . Years of Education: N/A   Occupational History  . retired Games developer     Social History Main Topics  . Smoking status: Current Every Day Smoker -- 0.25 packs/day for 54 years    Types: Cigarettes  . Smokeless tobacco: Never Used  . Alcohol Use: No     Comment: rarely  . Drug Use: No  . Sexual Activity: Not on file   Other Topics Concern  . Not on file   Social History Narrative   No living will   Requests that wife make medical decisions for him if he is unable   Would accept CPR.      Would accept mechanical ventilation.    Would accept feeding tube   Review of Systems Sleeps okay---except for occasional left shoulder pain due to cervical disc disease. Chronic left arm weakness Appetite is good Weight stable Full dentures Bowels are fine Voids well--- nocturia x 1 in early AM (5-6AM). No rashes or suspicious lesions No joint problems lately    Objective:   Physical Exam  Constitutional: He is oriented to person, place, and time. He appears well-developed and well-nourished. No distress.  HENT:  Mouth/Throat: Oropharynx is clear and moist. No oropharyngeal exudate.  Cardiovascular: Normal rate, regular rhythm and normal heart sounds.  Exam reveals no gallop.   No murmur heard. Feet slightly cool without palpable pulses  Pulmonary/Chest: Effort normal. No respiratory distress. He has no wheezes. He has no rales.  Slightly decreased breath sounds but clear  Abdominal: Soft. There is no tenderness.  Musculoskeletal: He exhibits no edema or tenderness.  Neurological: He is alert and oriented to person, place, and time.  President--- "Elyn Peers, Maudie Flakes, [place], [place]" [phone] "I don't do spelling" Recall 2/3  Skin: No rash noted. No erythema.  Psychiatric: He has a normal mood and affect. His behavior is normal.          Assessment & Plan:

## 2015-08-03 NOTE — Assessment & Plan Note (Signed)
No symptoms 

## 2015-08-04 LAB — CBC WITH DIFFERENTIAL/PLATELET
BASOS ABS: 0 10*3/uL (ref 0.0–0.1)
Basophils Relative: 0.4 % (ref 0.0–3.0)
Eosinophils Absolute: 0.1 10*3/uL (ref 0.0–0.7)
Eosinophils Relative: 2.4 % (ref 0.0–5.0)
HEMATOCRIT: 40.3 % (ref 39.0–52.0)
Hemoglobin: 13 g/dL (ref 13.0–17.0)
LYMPHS PCT: 33.8 % (ref 12.0–46.0)
Lymphs Abs: 2 10*3/uL (ref 0.7–4.0)
MCHC: 32.2 g/dL (ref 30.0–36.0)
MCV: 85 fl (ref 78.0–100.0)
Monocytes Absolute: 0.3 10*3/uL (ref 0.1–1.0)
Monocytes Relative: 5.6 % (ref 3.0–12.0)
NEUTROS ABS: 3.4 10*3/uL (ref 1.4–7.7)
Neutrophils Relative %: 57.8 % (ref 43.0–77.0)
PLATELETS: 296 10*3/uL (ref 150.0–400.0)
RBC: 4.75 Mil/uL (ref 4.22–5.81)
RDW: 16.3 % — ABNORMAL HIGH (ref 11.5–15.5)
WBC: 5.8 10*3/uL (ref 4.0–10.5)

## 2015-08-05 ENCOUNTER — Encounter: Payer: Self-pay | Admitting: *Deleted

## 2015-10-07 ENCOUNTER — Other Ambulatory Visit: Payer: Self-pay

## 2015-10-07 MED ORDER — CLOPIDOGREL BISULFATE 75 MG PO TABS: ORAL_TABLET | ORAL | Status: DC

## 2015-10-07 MED ORDER — HYDROCHLOROTHIAZIDE 25 MG PO TABS: ORAL_TABLET | ORAL | Status: DC

## 2015-10-07 NOTE — Telephone Encounter (Signed)
Refill sent for HCTZ 

## 2015-10-07 NOTE — Telephone Encounter (Signed)
Refill sent for plavix  

## 2015-12-28 ENCOUNTER — Other Ambulatory Visit: Payer: Self-pay

## 2015-12-28 MED ORDER — AMLODIPINE BESYLATE 5 MG PO TABS: ORAL_TABLET | ORAL | Status: DC

## 2015-12-28 NOTE — Telephone Encounter (Signed)
Refill sent for amlodipine 5 mg  

## 2016-03-14 ENCOUNTER — Other Ambulatory Visit: Payer: Self-pay | Admitting: Cardiovascular Disease

## 2016-03-14 DIAGNOSIS — I739 Peripheral vascular disease, unspecified: Secondary | ICD-10-CM

## 2016-03-17 ENCOUNTER — Other Ambulatory Visit: Payer: Self-pay | Admitting: Cardiovascular Disease

## 2016-03-17 DIAGNOSIS — I779 Disorder of arteries and arterioles, unspecified: Secondary | ICD-10-CM

## 2016-04-13 ENCOUNTER — Ambulatory Visit: Payer: Medicare Other | Admitting: Radiology

## 2016-04-13 ENCOUNTER — Ambulatory Visit: Payer: Medicare Other

## 2016-04-13 DIAGNOSIS — I739 Peripheral vascular disease, unspecified: Secondary | ICD-10-CM

## 2016-04-13 DIAGNOSIS — I779 Disorder of arteries and arterioles, unspecified: Secondary | ICD-10-CM

## 2016-04-13 DIAGNOSIS — I700 Atherosclerosis of aorta: Secondary | ICD-10-CM

## 2016-04-22 ENCOUNTER — Encounter: Payer: Self-pay | Admitting: Cardiovascular Disease

## 2016-04-22 ENCOUNTER — Ambulatory Visit (INDEPENDENT_AMBULATORY_CARE_PROVIDER_SITE_OTHER): Payer: Medicare Other | Admitting: Cardiovascular Disease

## 2016-04-22 VITALS — BP 132/64 | HR 68 | Ht 68.5 in | Wt 162.0 lb

## 2016-04-22 DIAGNOSIS — E785 Hyperlipidemia, unspecified: Secondary | ICD-10-CM | POA: Diagnosis not present

## 2016-04-22 DIAGNOSIS — I739 Peripheral vascular disease, unspecified: Secondary | ICD-10-CM

## 2016-04-22 DIAGNOSIS — I779 Disorder of arteries and arterioles, unspecified: Secondary | ICD-10-CM | POA: Diagnosis not present

## 2016-04-22 DIAGNOSIS — Z720 Tobacco use: Secondary | ICD-10-CM

## 2016-04-22 DIAGNOSIS — I10 Essential (primary) hypertension: Secondary | ICD-10-CM

## 2016-04-22 DIAGNOSIS — I2510 Atherosclerotic heart disease of native coronary artery without angina pectoris: Secondary | ICD-10-CM | POA: Diagnosis not present

## 2016-04-22 MED ORDER — ATORVASTATIN CALCIUM 40 MG PO TABS: ORAL_TABLET | ORAL | 3 refills | Status: DC

## 2016-04-22 NOTE — Patient Instructions (Signed)
Medication Instructions:  Your physician has recommended you make the following change in your medication:  STOP taking aspirin INCREASE atorvastatin to 40mg  once daily   Labwork: none  Testing/Procedures: none  Follow-Up: Your physician wants you to follow-up in: one year with Dr. Fletcher Anon.  You will receive a reminder letter in the mail two months in advance. If you don't receive a letter, please call our office to schedule the follow-up appointment.   Any Other Special Instructions Will Be Listed Below (If Applicable).     If you need a refill on your cardiac medications before your next appointment, please call your pharmacy.

## 2016-04-22 NOTE — Progress Notes (Signed)
Cardiology Office Note   Date:  04/22/2016   ID:  Valin, Hartsel [birth year], MRN [id number]  PCP:  Viviana Simpler, MD  Cardiologist:   [name], MD   Chief Complaint  Patient presents with  . Other    Follow up from test. Meds reviewed by the patient verbally. "doing well."       History of Present Illness: Sean [name] is a 75 y.o. male who presents for a followup visit regarding peripheral arterial disease and slightly abnormal stress test..  He has prolonged history of tobacco , hypertension and hyperlipidemia. He was seen in May of 2014 for right hip and buttock discomfort with walking 75-100 yards. His ABI was significantly abnormal bilaterally. 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 I performed self-expanding stent placement to the right external iliac artery without complications.  Nuclear stress test in 2014 showed evidence of a small inferior infarct without significant ischemia. Ejection fraction was 52%. He was treated medically given improvement in symptoms with medical therapy.  He has been doing very well and denies any chest pain or worsening dyspnea. No orthopnea or PND. He denies any leg claudication. Recent noninvasive vascular evaluation showed an ABI of 0.7 bilaterally with patent right iliac stent.  Past Medical History:  Diagnosis Date  . Arthritis   . Cervical disc disease   . COPD (chronic obstructive pulmonary disease) ([place])   . ED (erectile dysfunction)   . Hyperlipidemia   . PAD (peripheral artery disease) ([place]) 02/05/2013   05/14: Subtotally occluded right external iliac artery, long occlusion of the left proximal to mid SFA with 1 vessel runoff below the knee, diffusely diseased right SFA with short occlusion in the midsegment with three-vessel  runoff below the knee. Status post self-expanding stent placement to the right external iliac artery.  . Peptic ulcer   . Substance abuse     Past Surgical History:  Procedure Laterality Date  . ABDOMINAL AORTAGRAM N/A 02/13/2013   Procedure: ABDOMINAL Maxcine Ham;  Surgeon: Wellington Hampshire, MD;  Location: [facility];  Service: Cardiovascular;  Laterality: N/A;  . CERVICAL [place]  . HARVEST BONE MARROW     Donor  . ILIAC ARTERY STENT     right stent placement  . LOWER EXTREMITY ANGIOGRAM  02/13/2013   Procedure: LOWER EXTREMITY ANGIOGRAM;  Surgeon: Wellington Hampshire, MD;  Location: [facility];  Service: Cardiovascular;;  . PERCUTANEOUS STENT INTERVENTION Right 02/13/2013   Procedure: PERCUTANEOUS STENT INTERVENTION;  Surgeon: Wellington Hampshire, MD;  Location: [facility];  Service: Cardiovascular;  Laterality: Right;  rt ext iliac stent  . STOMACH SURGERY  1974   gastric ulcer     Current Outpatient Prescriptions  Medication Sig Dispense Refill  . amLODipine (NORVASC) 5 MG tablet TAKE 1 TABLET (5 MG TOTAL) BY MOUTH DAILY. 90 tablet 3  . aspirin 81 MG tablet Take 81 mg by mouth daily.    Marland Kitchen atorvastatin (LIPITOR) 20 MG tablet TAKE 1 TABLET (20 MG TOTAL) BY MOUTH AT BEDTIME. 90 tablet 3  . clopidogrel (PLAVIX) 75 MG tablet TAKE 1 TABLET (75 MG TOTAL) BY MOUTH DAILY. 90 tablet 3  . hydrochlorothiazide (HYDRODIURIL) 25 MG tablet TAKE 1 TABLET (25 MG TOTAL) BY MOUTH DAILY. 90 tablet 3   No current  facility-administered medications for this visit.     Allergies:   Review of patient's allergies indicates no known allergies.    Social History:  The patient  reports that he has been smoking Cigarettes.  He has a 13.50 pack-year smoking history. He has never used smokeless tobacco. He reports that he does not drink alcohol or use drugs.   Family History:  The patient's family history includes Arthritis in his sister; Clotting disorder in his mother; Heart disease in his father;  Leukemia in his son.    ROS:  Please see the history of present illness.   Otherwise, review of systems are positive for none.   All other systems are reviewed and negative.    PHYSICAL EXAM: VS:  BP 132/64 (BP Location: Left Arm, Patient Position: Sitting, Cuff Size: Normal)   Pulse 68   Ht 5' 8.5" (1.74 m)   Wt 162 lb (73.5 kg)   BMI 24.27 kg/m  , BMI Body mass index is 24.27 kg/m. GEN: Well nourished, well developed, in no acute distress  HEENT: normal  Neck: no JVD, carotid bruits, or masses Cardiac: RRR; no murmurs, rubs, or gallops,no edema  Respiratory:  clear to auscultation bilaterally, normal work of breathing GI: soft, nontender, nondistended, + BS MS: no deformity or atrophy  Skin: warm and dry, no rash Neuro:  Strength and sensation are intact Psych: euthymic mood, full affect   EKG:  EKG is ordered today. 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   Recent Labs: 08/03/2015: ALT 12; BUN 21; Creatinine, Ser 1.37; Hemoglobin 13.0; Platelets 296.0; Potassium 4.1; Sodium 138    Lipid Panel    Component Value Date/Time   CHOL 145 08/03/2015 1127   CHOL 156 06/23/2014 1129   TRIG 74.0 08/03/2015 1127   TRIG 134 06/23/2014 1129   HDL 33.70 (L) 08/03/2015 1127   HDL 37 (L) 06/23/2014 1129   CHOLHDL 4 08/03/2015 1127   VLDL 14.8 08/03/2015 1127   VLDL 27 06/23/2014 1129   LDLCALC 97 08/03/2015 1127   LDLCALC 92 06/23/2014 1129   LDLDIRECT 181.8 01/16/2013 1027      Wt Readings from Last 3 Encounters:  04/22/16 162 lb (73.5 kg)  08/03/15 165 lb (74.8 kg)  12/29/14 167 lb 12 oz (76.1 kg)       ASSESSMENT AND PLAN:  1.  Peripheral arterial disease: Status post stenting of the right external iliac artery with known bilateral SFA disease which is being managed medically. Currently with no claudication. I discontinued aspirin and asked him to continue with medical 75  mg once daily.  2. Presumed coronary artery disease based on abnormal stress test in 2014 which showed prior small inferior infarct. Currently with no anginal symptoms. Continue medical therapy.  3. Hyperlipidemia: I reviewed his most recent lipid profile which showed a cholesterol of 97. We should try to get his LDL below 70 and thus I increased the dose of atorvastatin 40 mg once daily.  4. Tobacco use: He continues to smoke and does not have a desire to quit at the present time.    Disposition:   FU with me in 1 year  Signed,  [name], MD  04/22/2016 2:15 PM    [place]

## 2016-06-08 ENCOUNTER — Telehealth: Payer: Self-pay | Admitting: Internal Medicine

## 2016-06-08 NOTE — Telephone Encounter (Signed)
LM for pt to sched flu/pneumonia shots and to sched AWV appt w/ Leisa week before CPE 11/22, js

## 2016-06-16 ENCOUNTER — Other Ambulatory Visit: Payer: Self-pay

## 2016-06-16 MED ORDER — ATORVASTATIN CALCIUM 40 MG PO TABS
40.0000 mg | ORAL_TABLET | Freq: Every day | ORAL | 3 refills | Status: DC
Start: 2016-06-16 — End: 2016-06-17

## 2016-06-17 ENCOUNTER — Other Ambulatory Visit: Payer: Self-pay

## 2016-06-17 MED ORDER — ATORVASTATIN CALCIUM 40 MG PO TABS: 40.0000 mg | ORAL_TABLET | Freq: Every day | ORAL | 3 refills | Status: DC

## 2016-08-03 ENCOUNTER — Encounter: Payer: Self-pay | Admitting: Internal Medicine

## 2016-08-19 ENCOUNTER — Other Ambulatory Visit: Payer: Self-pay

## 2016-09-23 ENCOUNTER — Other Ambulatory Visit: Payer: Self-pay | Admitting: *Deleted

## 2016-09-23 MED ORDER — HYDROCHLOROTHIAZIDE 25 MG PO TABS: ORAL_TABLET | ORAL | 3 refills | Status: DC

## 2016-09-23 MED ORDER — CLOPIDOGREL BISULFATE 75 MG PO TABS: ORAL_TABLET | ORAL | 3 refills | Status: DC

## 2016-09-26 ENCOUNTER — Encounter: Payer: Self-pay | Admitting: Gastroenterology

## 2016-10-06 ENCOUNTER — Encounter: Payer: Self-pay | Admitting: Internal Medicine

## 2016-10-06 ENCOUNTER — Ambulatory Visit (INDEPENDENT_AMBULATORY_CARE_PROVIDER_SITE_OTHER): Payer: Medicare Other | Admitting: Internal Medicine

## 2016-10-06 VITALS — BP 128/72 | HR 70 | Temp 98.0°F | Ht 67.0 in | Wt 165.0 lb

## 2016-10-06 DIAGNOSIS — Z0000 Encounter for general adult medical examination without abnormal findings: Secondary | ICD-10-CM

## 2016-10-06 DIAGNOSIS — Z7189 Other specified counseling: Secondary | ICD-10-CM

## 2016-10-06 DIAGNOSIS — I10 Essential (primary) hypertension: Secondary | ICD-10-CM

## 2016-10-06 DIAGNOSIS — J449 Chronic obstructive pulmonary disease, unspecified: Secondary | ICD-10-CM | POA: Diagnosis not present

## 2016-10-06 DIAGNOSIS — I739 Peripheral vascular disease, unspecified: Secondary | ICD-10-CM

## 2016-10-06 NOTE — Patient Instructions (Signed)
DASH Eating Plan DASH stands for "Dietary Approaches to Stop Hypertension." The DASH eating plan is a healthy eating plan that has been shown to reduce high blood pressure (hypertension). Additional health benefits may include reducing the risk of type 2 diabetes mellitus, heart disease, and stroke. The DASH eating plan may also help with weight loss. What do I need to know about the DASH eating plan? For the DASH eating plan, you will follow these general guidelines:  Choose foods with less than 150 milligrams of sodium per serving (as listed on the food label).  Use salt-free seasonings or herbs instead of table salt or sea salt.  Check with your health care provider or pharmacist before using salt substitutes.  Eat lower-sodium products. These are often labeled as "low-sodium" or "no salt added."  Eat fresh foods. Avoid eating a lot of canned foods.  Eat more vegetables, fruits, and low-fat dairy products.  Choose whole grains. Look for the word "whole" as the first word in the ingredient list.  Choose fish and skinless chicken or turkey more often than red meat. Limit fish, poultry, and meat to 6 oz (170 g) each day.  Limit sweets, desserts, sugars, and sugary drinks.  Choose heart-healthy fats.  Eat more home-cooked food and less restaurant, buffet, and fast food.  Limit fried foods.  Do not fry foods. Cook foods using methods such as baking, boiling, grilling, and broiling instead.  When eating at a restaurant, ask that your food be prepared with less salt, or no salt if possible. What foods can I eat? Seek help from a dietitian for individual calorie needs. Grains  Whole grain or whole wheat bread. Brown rice. Whole grain or whole wheat pasta. Quinoa, bulgur, and whole grain cereals. Low-sodium cereals. Corn or whole wheat flour tortillas. Whole grain cornbread. Whole grain crackers. Low-sodium crackers. Vegetables  Fresh or frozen vegetables (raw, steamed, roasted, or  grilled). Low-sodium or reduced-sodium tomato and vegetable juices. Low-sodium or reduced-sodium tomato sauce and paste. Low-sodium or reduced-sodium canned vegetables. Fruits  All fresh, canned (in natural juice), or frozen fruits. Meat and Other Protein Products  Ground beef (85% or leaner), grass-fed beef, or beef trimmed of fat. Skinless chicken or turkey. Ground chicken or turkey. Pork trimmed of fat. All fish and seafood. Eggs. Dried beans, peas, or lentils. Unsalted nuts and seeds. Unsalted canned beans. Dairy  Low-fat dairy products, such as skim or 1% milk, 2% or reduced-fat cheeses, low-fat ricotta or cottage cheese, or plain low-fat yogurt. Low-sodium or reduced-sodium cheeses. Fats and Oils  Tub margarines without trans fats. Light or reduced-fat mayonnaise and salad dressings (reduced sodium). Avocado. Safflower, olive, or canola oils. Natural peanut or almond butter. Other  Unsalted popcorn and pretzels. The items listed above may not be a complete list of recommended foods or beverages. Contact your dietitian for more options.  What foods are not recommended? Grains  White bread. White pasta. White rice. Refined cornbread. Bagels and croissants. Crackers that contain trans fat. Vegetables  Creamed or fried vegetables. Vegetables in a cheese sauce. Regular canned vegetables. Regular canned tomato sauce and paste. Regular tomato and vegetable juices. Fruits  Canned fruit in light or heavy syrup. Fruit juice. Meat and Other Protein Products  Fatty cuts of meat. Ribs, chicken wings, bacon, sausage, bologna, salami, chitterlings, fatback, hot dogs, bratwurst, and packaged luncheon meats. Salted nuts and seeds. Canned beans with salt. Dairy  Whole or 2% milk, cream, half-and-half, and cream cheese. Whole-fat or sweetened yogurt. Full-fat cheeses   or blue cheese. Nondairy creamers and whipped toppings. Processed cheese, cheese spreads, or cheese curds. Condiments  Onion and garlic  salt, seasoned salt, table salt, and sea salt. Canned and packaged gravies. Worcestershire sauce. Tartar sauce. Barbecue sauce. Teriyaki sauce. Soy sauce, including reduced sodium. Steak sauce. Fish sauce. Oyster sauce. Cocktail sauce. Horseradish. Ketchup and mustard. Meat flavorings and tenderizers. Bouillon cubes. Hot sauce. Tabasco sauce. Marinades. Taco seasonings. Relishes. Fats and Oils  Butter, stick margarine, lard, shortening, ghee, and bacon fat. Coconut, palm kernel, or palm oils. Regular salad dressings. Other  Pickles and olives. Salted popcorn and pretzels. The items listed above may not be a complete list of foods and beverages to avoid. Contact your dietitian for more information.  Where can I find more information? National Heart, Lung, and Blood Institute: www.nhlbi.nih.gov/health/health-topics/topics/dash/ This information is not intended to replace advice given to you by your health care provider. Make sure you discuss any questions you have with your health care provider. Document Released: 08/18/2011 Document Revised: 02/04/2016 Document Reviewed: 07/03/2013 Elsevier Interactive Patient Education  2017 Elsevier Inc.  

## 2016-10-06 NOTE — Progress Notes (Signed)
Pre visit review using our clinic review tool, if applicable. No additional management support is needed unless otherwise documented below in the visit note. 

## 2016-10-06 NOTE — Assessment & Plan Note (Signed)
BP Readings from Last 3 Encounters:  10/06/16 128/72  04/22/16 132/64  08/03/15 140/70   Good control

## 2016-10-06 NOTE — Assessment & Plan Note (Signed)
See social history Forms given 

## 2016-10-06 NOTE — Assessment & Plan Note (Signed)
Hip pain with walking sounds like stable claudication On plavix and statin

## 2016-10-06 NOTE — Assessment & Plan Note (Signed)
I have personally reviewed the Medicare Annual Wellness questionnaire and have noted 1. The patient's medical and social history 2. Their use of alcohol, tobacco or illicit drugs 3. Their current medications and supplements 4. The patient's functional ability including ADL's, fall risks, home safety risks and hearing or visual             impairment. 5. Diet and physical activities 6. Evidence for depression or mood disorders  The patients weight, height, BMI and visual acuity have been recorded in the chart I have made referrals, counseling and provided education to the patient based review of the above and I have provided the pt with a written personalized care plan for preventive services.  I have provided you with a copy of your personalized plan for preventive services. Please take the time to review along with your updated medication list.  Healthy eating info given Prefers no immunizations--even Td (urged him to get one if he ever has injury) No PSA due to age Due for colonoscopy

## 2016-10-06 NOTE — Progress Notes (Signed)
Subjective:    Patient ID: Sean [name], male    DOB: 1941-07-13, 76 y.o.   MRN: [id number]  HPI Here for Medicare wellness and follow up of chronic health conditions Reviewed form and advanced directives Reviewed other doctors Occasional drink of alcohol--with sons (fairly rare) Continues to smoke--still not willing to stop. Tries to exercise on elliptical trainer regularly Vision and hearing are okay No falls No depression or anhedonia Independent with instrumental ADLs No apparent memory problems  No chest pain No leg pain--even with exertion (put will get some hip pain if he walks for extended time--this goes away quickly with rest) No edema No palpitations No dizziness or syncope  Breathing is pretty good Will notice some dyspnea if he pushes it (certain work) No regular cough No wheezing  No problems with statin No myalgias or GI problems  Current Outpatient Prescriptions on File Prior to Visit  Medication Sig Dispense Refill  . amLODipine (NORVASC) 5 MG tablet TAKE 1 TABLET (5 MG TOTAL) BY MOUTH DAILY. 90 tablet 3  . atorvastatin (LIPITOR) 40 MG tablet Take 1 tablet (40 mg total) by mouth daily at 6 PM. 90 tablet 3  . clopidogrel (PLAVIX) 75 MG tablet TAKE 1 TABLET (75 MG TOTAL) BY MOUTH DAILY. 90 tablet 3  . hydrochlorothiazide (HYDRODIURIL) 25 MG tablet TAKE 1 TABLET (25 MG TOTAL) BY MOUTH DAILY. 90 tablet 3   No current facility-administered medications on file prior to visit.     No Known Allergies  Past Medical History:  Diagnosis Date  . Arthritis   . Cervical disc disease   . COPD (chronic obstructive pulmonary disease) ([place])   . ED (erectile dysfunction)   . Hyperlipidemia   . PAD (peripheral artery disease) ([facility]) 02/05/2013   05/14: Subtotally occluded right external iliac artery, long occlusion of the left proximal to mid SFA with 1 vessel runoff below the knee, diffusely diseased right SFA with short occlusion in the midsegment with  three-vessel runoff below the knee. Status post self-expanding stent placement to the right external iliac artery.  . Peptic ulcer   . Substance abuse     Past Surgical History:  Procedure Laterality Date  . ABDOMINAL AORTAGRAM N/A 02/13/2013   Procedure: ABDOMINAL Maxcine Ham;  Surgeon: Wellington Hampshire, MD;  Location: [facility];  Service: Cardiovascular;  Laterality: N/A;  . CERVICAL [place]  . HARVEST BONE MARROW     Donor  . ILIAC ARTERY STENT     right stent placement  . LOWER EXTREMITY ANGIOGRAM  02/13/2013   Procedure: LOWER EXTREMITY ANGIOGRAM;  Surgeon: Wellington Hampshire, MD;  Location: [facility];  Service: Cardiovascular;;  . PERCUTANEOUS STENT INTERVENTION Right 02/13/2013   Procedure: PERCUTANEOUS STENT INTERVENTION;  Surgeon: Wellington Hampshire, MD;  Location: [facility];  Service: Cardiovascular;  Laterality: Right;  rt ext iliac stent  . STOMACH SURGERY  1974   gastric ulcer    Family History  Problem Relation Age of Onset  . Clotting disorder Mother     blod clot  . Heart disease Father   . Arthritis Sister   . Leukemia Son     bone marrow transplant  . Hypertension Neg Hx   . Cancer Neg Hx     colon cancer    Social History   Social History  . Marital status: Married    Spouse name: N/A  . Number of children: 7  . Years of education: N/A   Occupational  History  . retired Games developer     Social History Main Topics  . Smoking status: Current Every Day Smoker    Packs/day: 0.25    Years: 54.00    Types: Cigarettes  . Smokeless tobacco: Never Used  . Alcohol use No     Comment: rarely  . Drug use: No  . Sexual activity: Not on file   Other Topics Concern  . Not on file   Social History Narrative   No living will   Requests that wife make medical decisions for him if he is unable   Would accept CPR.    Would accept mechanical ventilation.    Would accept feeding tube   Review of Systems Appetite is good Weight stable Sleeps  well Wears seat belt Full upper and lower dentures No heartburn or dysphagia  Bowels are regular. No blood Voids okay. Nocturia in early AM. Does have some urgency No rash or suspicious skin lesions No sig back or joint pain    Objective:   Physical Exam  Constitutional: He is oriented to person, place, and time. He appears well-developed and well-nourished. No distress.  HENT:  Mouth/Throat: Oropharynx is clear and moist. No oropharyngeal exudate.  Neck: Normal range of motion. Neck supple. No thyromegaly present.  Cardiovascular: Normal rate, regular rhythm and normal heart sounds.  Exam reveals no gallop.   No murmur heard. Feet warm without palpable pulses  Pulmonary/Chest: Effort normal. No respiratory distress. He has no wheezes. He has no rales.  Decreased breath sounds but clear  Abdominal: Soft. There is no tenderness.  Musculoskeletal: He exhibits no edema or tenderness.  Lymphadenopathy:    He has no cervical adenopathy.  Neurological: He is alert and oriented to person, place, and time.  President---"Donald Trump, Obama, Bush, CLinton, Bush" [id number] Wouldn't spell--not good at that Recall 3/3  Skin: No rash noted. No erythema.  Psychiatric: He has a normal mood and affect. His behavior is normal.          Assessment & Plan:

## 2016-10-06 NOTE — Assessment & Plan Note (Signed)
Mild DOE No Rx needed Not willing to stop smoking

## 2016-10-07 ENCOUNTER — Telehealth: Payer: Self-pay

## 2016-10-07 LAB — COMPREHENSIVE METABOLIC PANEL
ALT: 14 U/L (ref 0–53)
AST: 15 U/L (ref 0–37)
Albumin: 4.3 g/dL (ref 3.5–5.2)
Alkaline Phosphatase: 97 U/L (ref 39–117)
BUN: 23 mg/dL (ref 6–23)
CHLORIDE: 103 meq/L (ref 96–112)
CO2: 30 meq/L (ref 19–32)
Calcium: 9.7 mg/dL (ref 8.4–10.5)
Creatinine, Ser: 1.51 mg/dL — ABNORMAL HIGH (ref 0.40–1.50)
GFR: 58.34 mL/min — ABNORMAL LOW (ref >60.00)
GLUCOSE: 87 mg/dL (ref 70–99)
POTASSIUM: 4.7 meq/L (ref 3.5–5.1)
SODIUM: 138 meq/L (ref 135–145)
Total Bilirubin: 0.4 mg/dL (ref 0.2–1.2)
Total Protein: 7.3 g/dL (ref 6.0–8.3)

## 2016-10-07 LAB — LIPID PANEL
CHOLESTEROL: 154 mg/dL (ref 0–200)
HDL: 33.6 mg/dL — ABNORMAL LOW (ref >39.00)
LDL CALC: 97 mg/dL (ref 0–99)
NonHDL: 120.79
Total CHOL/HDL Ratio: 5
Triglycerides: 120 mg/dL (ref 0.0–149.0)
VLDL: 24 mg/dL (ref 0.0–40.0)

## 2016-10-07 LAB — CBC WITH DIFFERENTIAL/PLATELET
Basophils Absolute: 0 10*3/uL (ref 0.0–0.1)
Basophils Relative: 0.6 % (ref 0.0–3.0)
EOS PCT: 1.7 % (ref 0.0–5.0)
Eosinophils Absolute: 0.1 10*3/uL (ref 0.0–0.7)
HCT: 40 % (ref 39.0–52.0)
HEMOGLOBIN: 13.4 g/dL (ref 13.0–17.0)
LYMPHS ABS: 2.1 10*3/uL (ref 0.7–4.0)
Lymphocytes Relative: 27.6 % (ref 12.0–46.0)
MCHC: 33.6 g/dL (ref 30.0–36.0)
MCV: 82.4 fl (ref 78.0–100.0)
MONO ABS: 0.2 10*3/uL (ref 0.1–1.0)
Monocytes Relative: 2.7 % — ABNORMAL LOW (ref 3.0–12.0)
NEUTROS ABS: 5 10*3/uL (ref 1.4–7.7)
Neutrophils Relative %: 67.4 % (ref 43.0–77.0)
Platelets: 332 10*3/uL (ref 150.0–400.0)
RBC: 4.85 Mil/uL (ref 4.22–5.81)
RDW: 16.1 % — AB (ref 11.5–15.5)
WBC: 7.4 10*3/uL (ref 4.0–10.5)

## 2016-10-07 NOTE — Telephone Encounter (Signed)
Left message on machine to call back recall letter has been mailed.  Colon for March of this year to be scheduled

## 2016-10-07 NOTE — Telephone Encounter (Signed)
-----   Message from Milus Banister, MD sent at 10/07/2016  7:22 AM EST ----- [name], The letter that I sent to him was correct (stating 5 year recall). The 'results note' in EPIC was not correct.  Sorry for the confusion. We'll reach out to him.   [name], Can you call him. Reassure him that colonoscopy 5 year recall is the correct recommendation.  Please apologize for any confusion.  So he needs recall in next 1-2 months (5 years from his early 2013 colonoscopy).  [name]   DJ  ----- Message ----- From: [name], MD Sent: 10/06/2016   3:43 PM To: Milus Banister, MD  Linna Hoff, He thought he had 5 year recall for his polyps but your note said 3. Can you have your staff set up his follow up colonoscopy?  Rich

## 2016-10-10 NOTE — Telephone Encounter (Signed)
Pt has been advised and will schedule when letter is received for recall

## 2016-12-14 ENCOUNTER — Ambulatory Visit (INDEPENDENT_AMBULATORY_CARE_PROVIDER_SITE_OTHER): Payer: Medicare Other | Admitting: Physician Assistant

## 2016-12-14 ENCOUNTER — Encounter: Payer: Self-pay | Admitting: Physician Assistant

## 2016-12-14 VITALS — BP 134/60 | HR 74 | Ht 67.32 in | Wt 168.0 lb

## 2016-12-14 DIAGNOSIS — Z86010 Personal history of colonic polyps: Secondary | ICD-10-CM | POA: Diagnosis not present

## 2016-12-14 DIAGNOSIS — Z1211 Encounter for screening for malignant neoplasm of colon: Secondary | ICD-10-CM

## 2016-12-14 DIAGNOSIS — Z01818 Encounter for other preprocedural examination: Secondary | ICD-10-CM

## 2016-12-14 DIAGNOSIS — Z7901 Long term (current) use of anticoagulants: Secondary | ICD-10-CM | POA: Diagnosis not present

## 2016-12-14 DIAGNOSIS — Z1212 Encounter for screening for malignant neoplasm of rectum: Secondary | ICD-10-CM

## 2016-12-14 MED ORDER — NA SULFATE-K SULFATE-MG SULF 17.5-3.13-1.6 GM/177ML PO SOLN
1.0000 | ORAL | 0 refills | Status: DC
Start: 2016-12-14 — End: 2017-03-03

## 2016-12-14 NOTE — Patient Instructions (Signed)
You have been scheduled for a colonoscopy. Please follow written instructions given to you at your visit today.  Please pick up your prep supplies at the pharmacy within the next 1-3 days. If you use inhalers (even only as needed), please bring them with you on the day of your procedure. Your physician has requested that you go to www.startemmi.com and enter the access code given to you at your visit today. This web site gives a general overview about your procedure. However, you should still follow specific instructions given to you by our office regarding your preparation for the procedure.  You will be contacted by our office prior to your procedure for directions on holding your Plavix.  If you do not hear from our office 1 week prior to your scheduled procedure, please call [phone] to discuss.  

## 2016-12-14 NOTE — Progress Notes (Signed)
Chief Complaint: Preprocedural exam for screening colonoscopy in a patient on chronic anticoagulation  HPI:  Sean [name] is a 76 year old African-American male with a past medical history COPD, hyperlipidemia, PAD status post stent on Plavix as well as others listed below, who was referred to me by [name], MD for a preprocedural exam for screening colonoscopy .     Patient is regularly followed by Dr. Ardis Hughs and was last seen 11/11/11 for a screening colonoscopy with a finding of 3 polyps, 2 of which were tubular adenomas. He also has a history of tubular adenomas and it was recommended he repeat his colonoscopy in 3 years.   Patient tells me today that since being seen last year he has had no large changes in his health. He has remained on the same medicines that "I have always been on". He does not specifically recall having to stop his Plavix in the past but "I do whatever they tell me". Patient denies oxygen use or previous history of seizures.   Patient denies fever, chills, blood in his stool, melena, weight loss, fatigue, nausea, vomiting, change in bowel habits or abdominal pain.  Past Medical History:  Diagnosis Date  . Arthritis   . Cervical disc disease   . COPD (chronic obstructive pulmonary disease) ([place])   . ED (erectile dysfunction)   . Hyperlipidemia   . PAD (peripheral artery disease) ([place]) 02/05/2013   05/14: Subtotally occluded right external iliac artery, long occlusion of the left proximal to mid SFA with 1 vessel runoff below the knee, diffusely diseased right SFA with short occlusion in the midsegment with three-vessel runoff below the knee. Status post self-expanding stent placement to the right external iliac artery.  . Peptic ulcer   . Substance abuse     Past Surgical History:  Procedure Laterality Date  . ABDOMINAL AORTAGRAM N/A 02/13/2013   Procedure: ABDOMINAL Maxcine [place];  Surgeon: Wellington Hampshire, MD;  Location: [facility];  Service: Cardiovascular;   Laterality: N/A;  . CERVICAL [place]  . HARVEST BONE MARROW     Donor  . ILIAC ARTERY STENT     right stent placement  . LOWER EXTREMITY ANGIOGRAM  02/13/2013   Procedure: LOWER EXTREMITY ANGIOGRAM;  Surgeon: Wellington Hampshire, MD;  Location: [facility];  Service: Cardiovascular;;  . PERCUTANEOUS STENT INTERVENTION Right 02/13/2013   Procedure: PERCUTANEOUS STENT INTERVENTION;  Surgeon: Wellington Hampshire, MD;  Location: [facility];  Service: Cardiovascular;  Laterality: Right;  rt ext iliac stent  . STOMACH SURGERY  1974   gastric ulcer    Current Outpatient Prescriptions  Medication Sig Dispense Refill  . amLODipine (NORVASC) 5 MG tablet TAKE 1 TABLET (5 MG TOTAL) BY MOUTH DAILY. 90 tablet 3  . atorvastatin (LIPITOR) 40 MG tablet Take 1 tablet (40 mg total) by mouth daily at 6 PM. 90 tablet 3  . clopidogrel (PLAVIX) 75 MG tablet TAKE 1 TABLET (75 MG TOTAL) BY MOUTH DAILY. 90 tablet 3  . hydrochlorothiazide (HYDRODIURIL) 25 MG tablet TAKE 1 TABLET (25 MG TOTAL) BY MOUTH DAILY. 90 tablet 3   No current facility-administered medications for this visit.     Allergies as of 12/14/2016  . (No Known Allergies)    Family History  Problem Relation Age of Onset  . Clotting disorder Mother     blood clot  . Heart disease Father   . Arthritis Sister   . Leukemia Son     bone marrow transplant  .  Hypertension Neg Hx   . Colon cancer Neg Hx   . Stomach cancer Neg Hx     Social History   Social History  . Marital status: Married    Spouse name: N/A  . Number of children: 7  . Years of education: N/A   Occupational History  . retired Games developer     Social History Main Topics  . Smoking status: Current Every Day Smoker    Packs/day: 0.25    Years: 54.00    Types: Cigarettes  . Smokeless tobacco: Never Used  . Alcohol use No     Comment: rarely  . Drug use: Yes    Frequency: 7.0 times per week    Types: Marijuana  . Sexual activity: Not on file   Other Topics  Concern  . Not on file   Social History Narrative   No living will   Requests that wife make medical decisions for him if he is unable   Would accept CPR.    Would accept mechanical ventilation.    Would accept feeding tube    Review of Systems:    Constitutional: No weight loss, fever or chills Skin: No rash  Cardiovascular: No chest pain Respiratory: No SOB Gastrointestinal: See HPI and otherwise negative Genitourinary: No dysuria or change in urinary frequency Neurological: No headache Musculoskeletal: No new muscle or joint pain Hematologic: No bleeding or bruising Psychiatric: No history of depression or anxiety   Physical Exam:  Vital signs: BP 134/60   Pulse 74   Ht 5' 7.32" (1.71 m)   Wt 168 lb (76.2 kg)   BMI 26.06 kg/m   Constitutional:   Pleasant African American male appears to be in NAD, Well developed, Well nourished, alert and cooperative Head:  Normocephalic and atraumatic. Eyes:   PEERL, EOMI. No icterus. Conjunctiva pink. Ears:  Normal auditory acuity. Neck:  Supple Throat: Oral cavity and pharynx without inflammation, swelling or lesion.  Respiratory: Respirations even and unlabored. Lungs clear to auscultation bilaterally.   No wheezes, crackles, or rhonchi.  Cardiovascular: Normal S1, S2. No MRG. Regular rate and rhythm. No peripheral edema, cyanosis or pallor.  Gastrointestinal:  Soft, nondistended, nontender. No rebound or guarding. Normal bowel sounds. No appreciable masses or hepatomegaly. Rectal:  Not performed.  Msk:  Symmetrical without gross deformities. Without edema, no deformity or joint abnormality.  Neurologic:  Alert and  oriented x4;  grossly normal neurologically.  Skin:   Dry and intact without significant lesions or rashes. Psychiatric:  Demonstrates good judgement and reason without abnormal affect or behaviors.  MOST RECENT LABS: CBC    Component Value Date/Time   WBC 7.4 10/06/2016 1605   RBC 4.85 10/06/2016 1605   HGB  13.4 10/06/2016 1605   HCT 40.0 10/06/2016 1605   PLT 332.0 10/06/2016 1605   MCV 82.4 10/06/2016 1605   MCH 26.9 05/20/2013 1454   MCHC 33.6 10/06/2016 1605   RDW 16.1 (H) 10/06/2016 1605   RDW 15.6 (H) 05/20/2013 1454   LYMPHSABS 2.1 10/06/2016 1605   LYMPHSABS 2.3 05/20/2013 1454   MONOABS 0.2 10/06/2016 1605   EOSABS 0.1 10/06/2016 1605   EOSABS 0.2 05/20/2013 1454   BASOSABS 0.0 10/06/2016 1605   BASOSABS 0.0 05/20/2013 1454    CMP     Component Value Date/Time   NA 138 10/06/2016 1605   NA 140 03/19/2013 1155   K 4.7 10/06/2016 1605   CL 103 10/06/2016 1605   CO2 30 10/06/2016 1605   GLUCOSE  87 10/06/2016 1605   BUN 23 10/06/2016 1605   BUN 15 03/19/2013 1155   CREATININE 1.51 (H) 10/06/2016 1605   CALCIUM 9.7 10/06/2016 1605   PROT 7.3 10/06/2016 1605   PROT 7.6 06/23/2014 1129   ALBUMIN 4.3 10/06/2016 1605   ALBUMIN 3.9 06/23/2014 1129   AST 15 10/06/2016 1605   AST 19 06/23/2014 1129   ALT 14 10/06/2016 1605   ALT 19 06/23/2014 1129   ALKPHOS 97 10/06/2016 1605   ALKPHOS 113 06/23/2014 1129   BILITOT 0.4 10/06/2016 1605   BILITOT 0.4 06/23/2014 1129   GFRNONAA 60 03/19/2013 1155   GFRAA 70 03/19/2013 1155    Assessment: 1. Preprocedure examination for screening colonoscopy: Patient's last colonoscopy 11/11/11 with recommendations for repeat in 3 years due to history of adenomatous polyps, due now 2. Chronic anticoagulation: With Plavix for PAD and stent placement 3. Personal history of adenomatous polyps  Plan: 1. Scheduled patient for a surveillance colonoscopy with Dr. Ardis Hughs. Discussed risks, benefits, limitations and alternatives and the patient agrees to proceed. This was scheduled in the [facility]. 2. Patient was advised to hold his Plavix for 5 days prior to the procedure. We will discuss holding his Plavix with his cardiologist to insure this is acceptable for the patient. 3. Patient to return to clinic per recommendations from Dr. Ardis Hughs after time  procedure.  Ellouise Newer, PA-C [facility] 12/14/2016, 10:11 AM  Cc: [name], MD

## 2016-12-15 NOTE — Progress Notes (Signed)
I agree with the above note, plan 

## 2016-12-17 ENCOUNTER — Other Ambulatory Visit: Payer: Self-pay | Admitting: Cardiovascular Disease

## 2016-12-26 NOTE — Progress Notes (Signed)
Called patient no answer and no voicemail  

## 2017-02-16 ENCOUNTER — Telehealth: Payer: Self-pay | Admitting: Emergency Medicine

## 2017-02-16 NOTE — Telephone Encounter (Signed)
   ARLIND KLINGERMAN 1940/12/12 [id number]  Dear Dr. Fletcher Anon:  We have scheduled the above named patient for a coloonscopy procedure. Our records show that he is on anticoagulation therapy.  Please advise as to whether the patient may come off their therapy of Plavix 5 days prior to their procedure which is scheduled.  Please route your response to Tinnie Gens, CMA or fax response to [phone].  Sincerely,    [facility]

## 2017-02-16 NOTE — Telephone Encounter (Signed)
Plavix can be held 5 days before the procedure and should be resumed after as long as no bleeding issues.  Dr. Fletcher Anon

## 2017-02-17 NOTE — Telephone Encounter (Signed)
Pt aware and verbalized understanding.  

## 2017-02-20 ENCOUNTER — Encounter: Payer: Self-pay | Admitting: Gastroenterology

## 2017-03-03 ENCOUNTER — Ambulatory Visit (AMBULATORY_SURGERY_CENTER): Payer: Medicare Other | Admitting: Gastroenterology

## 2017-03-03 ENCOUNTER — Encounter: Payer: Self-pay | Admitting: Gastroenterology

## 2017-03-03 VITALS — BP 138/72 | HR 62 | Temp 98.0°F | Resp 14 | Ht 67.0 in | Wt 168.0 lb

## 2017-03-03 DIAGNOSIS — K621 Rectal polyp: Secondary | ICD-10-CM

## 2017-03-03 DIAGNOSIS — I2510 Atherosclerotic heart disease of native coronary artery without angina pectoris: Secondary | ICD-10-CM | POA: Diagnosis not present

## 2017-03-03 DIAGNOSIS — Z86010 Personal history of colonic polyps: Secondary | ICD-10-CM

## 2017-03-03 DIAGNOSIS — D124 Benign neoplasm of descending colon: Secondary | ICD-10-CM

## 2017-03-03 DIAGNOSIS — K635 Polyp of colon: Secondary | ICD-10-CM

## 2017-03-03 DIAGNOSIS — D128 Benign neoplasm of rectum: Secondary | ICD-10-CM

## 2017-03-03 DIAGNOSIS — D126 Benign neoplasm of colon, unspecified: Secondary | ICD-10-CM

## 2017-03-03 DIAGNOSIS — D125 Benign neoplasm of sigmoid colon: Secondary | ICD-10-CM

## 2017-03-03 DIAGNOSIS — Z7901 Long term (current) use of anticoagulants: Secondary | ICD-10-CM

## 2017-03-03 DIAGNOSIS — D120 Benign neoplasm of cecum: Secondary | ICD-10-CM | POA: Diagnosis not present

## 2017-03-03 DIAGNOSIS — J449 Chronic obstructive pulmonary disease, unspecified: Secondary | ICD-10-CM | POA: Diagnosis not present

## 2017-03-03 DIAGNOSIS — D122 Benign neoplasm of ascending colon: Secondary | ICD-10-CM | POA: Diagnosis not present

## 2017-03-03 DIAGNOSIS — I10 Essential (primary) hypertension: Secondary | ICD-10-CM | POA: Diagnosis not present

## 2017-03-03 MED ORDER — SODIUM CHLORIDE 0.9 % IV SOLN: 500.0000 mL | INTRAVENOUS | Status: DC

## 2017-03-03 NOTE — Patient Instructions (Signed)
YOU HAD AN ENDOSCOPIC PROCEDURE TODAY AT [facility]:   Refer to the procedure report that was given to you for any specific questions about what was found during the examination.  If the procedure report does not answer your questions, please call your gastroenterologist to clarify.  If you requested that your care partner not be given the details of your procedure findings, then the procedure report has been included in a sealed envelope for you to review at your convenience later.  YOU SHOULD EXPECT: Some feelings of bloating in the abdomen. Passage of more gas than usual.  Walking can help get rid of the air that was put into your GI tract during the procedure and reduce the bloating. If you had a lower endoscopy (such as a colonoscopy or flexible sigmoidoscopy) you may notice spotting of blood in your stool or on the toilet paper. If you underwent a bowel prep for your procedure, you may not have a normal bowel movement for a few days.  Please Note:  You might notice some irritation and congestion in your nose or some drainage.  This is from the oxygen used during your procedure.  There is no need for concern and it should clear up in a day or so.  SYMPTOMS TO REPORT IMMEDIATELY:   Following lower endoscopy (colonoscopy or flexible sigmoidoscopy):  Excessive amounts of blood in the stool  Significant tenderness or worsening of abdominal pains  Swelling of the abdomen that is new, acute  Fever of 100F or higher    For urgent or emergent issues, a gastroenterologist can be reached at any hour by calling [phone].   DIET:  We do recommend a small meal at first, but then you may proceed to your regular diet.  Drink plenty of fluids but you should avoid alcoholic beverages for 24 hours.  ACTIVITY:  You should plan to take it easy for the rest of today and you should NOT DRIVE or use heavy machinery until tomorrow (because of the sedation medicines used during the test).     FOLLOW UP: Our staff will call the number listed on your records the next business day following your procedure to check on you and address any questions or concerns that you may have regarding the information given to you following your procedure. If we do not reach you, we will leave a message.  However, if you are feeling well and you are not experiencing any problems, there is no need to return our call.  We will assume that you have returned to your regular daily activities without incident.  If any biopsies were taken you will be contacted by phone or by letter within the next 1-3 weeks.  Please call us at [phone] if you have not heard about the biopsies in 3 weeks.    SIGNATURES/CONFIDENTIALITY: You and/or your care partner have signed paperwork which will be entered into your electronic medical record.  These signatures attest to the fact that that the information above on your After Visit Summary has been reviewed and is understood.  Full responsibility of the confidentiality of this discharge information lies with you and/or your care-partner.   Polyp information given.    Recall 10 years-2028.

## 2017-03-03 NOTE — Progress Notes (Signed)
Called to room to assist during endoscopic procedure.  Patient ID and intended procedure confirmed with present staff. Received instructions for my participation in the procedure from the performing physician.  

## 2017-03-03 NOTE — Op Note (Signed)
[facility] Patient Name: Sean [name] Procedure Date: 03/03/2017 10:02 AM MRN: [id number] Endoscopist: Milus Banister , MD Age: 76 Referring MD:  Date of Birth: [birth year] Gender: Male Account #: [id number] Procedure:                Colonoscopy Indications:              High risk colon cancer surveillance: Personal                            history of colonic polyps Medicines:                Monitored Anesthesia Care Procedure:                Pre-Anesthesia Assessment:                           - Prior to the procedure, a History and Physical                            was performed, and patient medications and                            allergies were reviewed. The patient's tolerance of                            previous anesthesia was also reviewed. The risks                            and benefits of the procedure and the sedation                            options and risks were discussed with the patient.                            All questions were answered, and informed consent                            was obtained. Prior Anticoagulants: The patient has                            taken Plavix (clopidogrel), last dose was 5 days                            prior to procedure. ASA Grade Assessment: II - A                            patient with mild systemic disease. After reviewing                            the risks and benefits, the patient was deemed in                            satisfactory condition to undergo the procedure.  After obtaining informed consent, the colonoscope                            was passed under direct vision. Throughout the                            procedure, the patient's blood pressure, pulse, and                            oxygen saturations were monitored continuously. The                            Colonoscope was introduced through the anus and                            advanced to the the  cecum, identified by                            appendiceal orifice and ileocecal valve. The                            colonoscopy was performed without difficulty. The                            patient tolerated the procedure well. The quality                            of the bowel preparation was excellent. The                            ileocecal valve, appendiceal orifice, and rectum                            were photographed. Scope In: 10:05:46 AM Scope Out: 10:16:58 AM Scope Withdrawal Time: 0 hours 9 minutes 38 seconds  Total Procedure Duration: 0 hours 11 minutes 12 seconds  Findings:                 Six sessile polyps were found in the rectum,                            sigmoid colon, descending colon, ascending colon                            and cecum. The polyps were 2 to 5 mm in size. These                            polyps were removed with a cold snare. Resection                            and retrieval were complete.                           The exam was otherwise without abnormality on  direct and retroflexion views. Complications:            No immediate complications. Estimated blood loss:                            None. Estimated Blood Loss:     Estimated blood loss: none. Impression:               - Six 2 to 5 mm polyps in the rectum, in the                            sigmoid colon, in the descending colon, in the                            ascending colon and in the cecum, removed with a                            cold snare. Resected and retrieved.                           - The examination was otherwise normal on direct                            and retroflexion views. Recommendation:           - Patient has a contact number available for                            emergencies. The signs and symptoms of potential                            delayed complications were discussed with the                            patient. Return  to normal activities tomorrow.                            Written discharge instructions were provided to the                            patient.                           - Resume previous diet.                           - Continue present medications.                           You will receive a letter within 2-3 weeks with the                            pathology results and my final recommendations.                           If the polyp(s) is proven to be 'pre-cancerous'  on                            pathology, you will need repeat colonoscopy in 3                            years. If the polyp(s) is NOT 'precancerous' on                            pathology then you should repeat colon cancer                            screening in 10 years with colonoscopy without need                            for colon cancer screening by any method prior to                            then (including stool testing). Milus Banister, MD 03/03/2017 10:19:29 AM This report has been signed electronically.

## 2017-03-03 NOTE — Progress Notes (Signed)
Report to PACU, RN, vss, BBS= Clear.  

## 2017-03-03 NOTE — Progress Notes (Signed)
Pt's states no medical or surgical changes since previsit or office visit. 

## 2017-03-06 ENCOUNTER — Telehealth: Payer: Self-pay

## 2017-03-06 NOTE — Telephone Encounter (Signed)
  Follow up Call-  Call back number 03/03/2017  Post procedure Call Back phone  # [phone]  Permission to leave phone message Yes  Some recent data might be hidden     Patient questions:  Do you have a fever, pain , or abdominal swelling? No. Pain Score  0 *  Have you tolerated food without any problems? Yes.    Have you been able to return to your normal activities? Yes.    Do you have any questions about your discharge instructions: Diet   No. Medications  No. Follow up visit  No.  Do you have questions or concerns about your Care? No.  Actions: * If pain score is 4 or above: No action needed, pain <4.

## 2017-03-12 ENCOUNTER — Encounter: Payer: Self-pay | Admitting: Gastroenterology

## 2017-04-28 ENCOUNTER — Other Ambulatory Visit: Payer: Self-pay | Admitting: Cardiovascular Disease

## 2017-04-28 DIAGNOSIS — I779 Disorder of arteries and arterioles, unspecified: Secondary | ICD-10-CM

## 2017-05-05 ENCOUNTER — Other Ambulatory Visit: Payer: Self-pay | Admitting: Cardiovascular Disease

## 2017-05-05 ENCOUNTER — Encounter: Payer: Self-pay | Admitting: Cardiovascular Disease

## 2017-05-05 ENCOUNTER — Ambulatory Visit (INDEPENDENT_AMBULATORY_CARE_PROVIDER_SITE_OTHER): Payer: Medicare Other | Admitting: Cardiovascular Disease

## 2017-05-05 ENCOUNTER — Ambulatory Visit: Payer: Medicare Other

## 2017-05-05 VITALS — BP 138/66 | HR 85 | Ht 68.0 in | Wt 161.8 lb

## 2017-05-05 DIAGNOSIS — I739 Peripheral vascular disease, unspecified: Secondary | ICD-10-CM | POA: Diagnosis not present

## 2017-05-05 DIAGNOSIS — E785 Hyperlipidemia, unspecified: Secondary | ICD-10-CM

## 2017-05-05 DIAGNOSIS — I779 Disorder of arteries and arterioles, unspecified: Secondary | ICD-10-CM | POA: Diagnosis not present

## 2017-05-05 DIAGNOSIS — I10 Essential (primary) hypertension: Secondary | ICD-10-CM

## 2017-05-05 DIAGNOSIS — Z720 Tobacco use: Secondary | ICD-10-CM | POA: Diagnosis not present

## 2017-05-05 DIAGNOSIS — I2510 Atherosclerotic heart disease of native coronary artery without angina pectoris: Secondary | ICD-10-CM

## 2017-05-05 MED ORDER — ROSUVASTATIN CALCIUM 40 MG PO TABS: 40.0000 mg | ORAL_TABLET | Freq: Every day | ORAL | 3 refills | Status: DC

## 2017-05-05 NOTE — Progress Notes (Signed)
Cardiology Office Note   Date:  05/05/2017   ID:  Sean [name] 05/04/1941, MRN [id number]  PCP:  [name], MD  Cardiologist:   [name], MD   Chief Complaint  Patient presents with  . other    12 month follow up. Patient states he is doing "Very good" Meds reviewed verbally with patient      History of Present Illness: Sean [name] is a 76 y.o. male who presents for a followup visit regarding peripheral arterial disease and slightly abnormal stress test..  He has prolonged history of tobacco , hypertension and hyperlipidemia.  He is status post right external iliac artery stent placement in May 2014 for severe claudication. He is known to have long occlusion of the left SFA and severe diffuse disease of the right SFA currently being  managed medically. Nuclear stress test in 2014 showed evidence of a small inferior infarct without significant ischemia. Ejection fraction was 52%.  He has been doing well and denies any chest pain or shortness of breath. He denies leg claudication. He continues to smoke close to one pack per day.  Past Medical History:  Diagnosis Date  . Arthritis   . Cervical disc disease   . COPD (chronic obstructive pulmonary disease) ([place])   . ED (erectile dysfunction)   . Hyperlipidemia   . PAD (peripheral artery disease) ([place]) 02/05/2013   05/14: Subtotally occluded right external iliac artery, long occlusion of the left proximal to mid SFA with 1 vessel runoff below the knee, diffusely diseased right SFA with short occlusion in the midsegment with three-vessel runoff below the knee. Status post self-expanding stent placement to the right external iliac artery.  . Peptic ulcer   . Substance abuse     Past Surgical History:  Procedure Laterality Date  . ABDOMINAL AORTAGRAM N/A 02/13/2013   Procedure: ABDOMINAL Maxcine Ham;  Surgeon: Wellington Hampshire, MD;  Location: [place] CATH LAB;  Service: Cardiovascular;  Laterality: N/A;  .  CERVICAL [place]  . HARVEST BONE MARROW     Donor  . ILIAC ARTERY STENT     right stent placement  . LOWER EXTREMITY ANGIOGRAM  02/13/2013   Procedure: LOWER EXTREMITY ANGIOGRAM;  Surgeon: Wellington Hampshire, MD;  Location: [place] CATH LAB;  Service: Cardiovascular;;  . PERCUTANEOUS STENT INTERVENTION Right 02/13/2013   Procedure: PERCUTANEOUS STENT INTERVENTION;  Surgeon: Wellington Hampshire, MD;  Location: [place] CATH LAB;  Service: Cardiovascular;  Laterality: Right;  rt ext iliac stent  . STOMACH SURGERY  1974   gastric ulcer     Current Outpatient Prescriptions  Medication Sig Dispense Refill  . amLODipine (NORVASC) 5 MG tablet TAKE 1 TABLET (5 MG TOTAL) BY MOUTH DAILY. 90 tablet 3  . clopidogrel (PLAVIX) 75 MG tablet TAKE 1 TABLET (75 MG TOTAL) BY MOUTH DAILY. 90 tablet 3  . hydrochlorothiazide (HYDRODIURIL) 25 MG tablet TAKE 1 TABLET (25 MG TOTAL) BY MOUTH DAILY. 90 tablet 3   Current Facility-Administered Medications  Medication Dose Route Frequency Provider Last Rate Last Dose  . 0.9 %  sodium chloride infusion  500 mL Intravenous Continuous Milus Banister, MD        Allergies:   Patient has no known allergies.    Social History:  The patient  reports that he has been smoking Cigarettes.  He has a 13.50 pack-year smoking history. He has never used smokeless tobacco. He reports that he uses drugs, including Marijuana, about 7 times  per week. He reports that he does not drink alcohol.   Family History:  The patient's family history includes Arthritis in his sister; Clotting disorder in his mother; Heart disease in his father; Leukemia in his son.    ROS:  Please see the history of present illness.   Otherwise, review of systems are positive for none.   All other systems are reviewed and negative.    PHYSICAL EXAM: VS:  BP 138/66 (BP Location: Left Arm, Patient Position: Sitting, Cuff Size: Normal)   Pulse 85   Ht 5\' 8"  (1.727 m)   Wt 161 lb 12 oz (73.4 kg)   BMI 24.59  kg/m  , BMI Body mass index is 24.59 kg/m. GEN: Well nourished, well developed, in no acute distress  HEENT: normal  Neck: no JVD, carotid bruits, or masses Cardiac: RRR; no murmurs, rubs, or gallops,no edema  Respiratory:  clear to auscultation bilaterally, normal work of breathing GI: soft, nontender, nondistended, + BS MS: no deformity or atrophy  Skin: warm and dry, no rash Neuro:  Strength and sensation are intact Psych: euthymic mood, full affect   EKG:  EKG is ordered today. The ekg ordered today demonstrates normal sinus rhythm with left anterior fascicular block and possible old inferior infarct.   Recent Labs: 10/06/2016: ALT 14; BUN 23; Creatinine, Ser 1.51; Hemoglobin 13.4; Platelets 332.0; Potassium 4.7; Sodium 138    Lipid Panel    Component Value Date/Time   CHOL 154 10/06/2016 1605   CHOL 156 06/23/2014 1129   TRIG 120.0 10/06/2016 1605   TRIG 134 06/23/2014 1129   HDL 33.60 (L) 10/06/2016 1605   HDL 37 (L) 06/23/2014 1129   CHOLHDL 5 10/06/2016 1605   VLDL 24.0 10/06/2016 1605   VLDL 27 06/23/2014 1129   LDLCALC 97 10/06/2016 1605   LDLCALC 92 06/23/2014 1129   LDLDIRECT 181.8 01/16/2013 1027      Wt Readings from Last 3 Encounters:  05/05/17 161 lb 12 oz (73.4 kg)  03/03/17 168 lb (76.2 kg)  12/14/16 168 lb (76.2 kg)       ASSESSMENT AND PLAN:  1.  Peripheral arterial disease: Status post stenting of the right external iliac artery with known bilateral SFA disease which is being managed medically. Currently with no claudication.  Vascular studies today showed stable ABI close 0.7 bilaterally with patent right external iliac artery stent.  2. Presumed coronary artery disease based on abnormal stress test in 2014 which showed prior small inferior infarct. Currently with no anginal symptoms. Continue medical therapy.  3. Hyperlipidemia: Most recent LDL was 97. Thus, I elected to switch him from atorvastatin to rosuvastatin 40 mg daily. Recheck  lipid and liver profile in 6 weeks and consider adding Zetia if LDL remains above 70.  4. Tobacco use: He reports inability to quit smoking.  5. Essential hypertension: Blood pressure is mildly elevated but usually his blood pressure is more controlled. We can consider adding losartan hydrochlorothiazide if needed.   Disposition:   FU with me in 1 year  Signed,  [name], MD  05/05/2017 11:31 AM    [place]

## 2017-05-05 NOTE — Patient Instructions (Signed)
Medication Instructions:  Your physician has recommended you make the following change in your medication:  STOP taking atorvastatin START taking rosuvastatin 40mg  once daily .    Labwork: Lipid and liver profile in 6 weeks at the [facility] lab. No appointment needed. Nothing to eat or drink after midnight the evening before your labs.   Testing/Procedures: none  Follow-Up: Your physician wants you to follow-up in: one year with Dr. Fletcher Anon.  You will receive a reminder letter in the mail two months in advance. If you don't receive a letter, please call our office to schedule the follow-up appointment.   Any Other Special Instructions Will Be Listed Below (If Applicable).     If you need a refill on your cardiac medications before your next appointment, please call your pharmacy.

## 2017-09-17 ENCOUNTER — Other Ambulatory Visit: Payer: Self-pay | Admitting: Cardiovascular Disease

## 2017-09-29 ENCOUNTER — Other Ambulatory Visit: Payer: Self-pay | Admitting: *Deleted

## 2017-09-29 DIAGNOSIS — I739 Peripheral vascular disease, unspecified: Secondary | ICD-10-CM

## 2017-10-12 ENCOUNTER — Encounter: Payer: Self-pay | Admitting: Internal Medicine

## 2017-10-12 ENCOUNTER — Ambulatory Visit (INDEPENDENT_AMBULATORY_CARE_PROVIDER_SITE_OTHER): Payer: Medicare Other | Admitting: Internal Medicine

## 2017-10-12 VITALS — BP 140/68 | HR 97 | Temp 98.4°F | Ht 66.75 in | Wt 172.2 lb

## 2017-10-12 DIAGNOSIS — Z0000 Encounter for general adult medical examination without abnormal findings: Secondary | ICD-10-CM | POA: Diagnosis not present

## 2017-10-12 DIAGNOSIS — I2510 Atherosclerotic heart disease of native coronary artery without angina pectoris: Secondary | ICD-10-CM | POA: Diagnosis not present

## 2017-10-12 DIAGNOSIS — J449 Chronic obstructive pulmonary disease, unspecified: Secondary | ICD-10-CM | POA: Diagnosis not present

## 2017-10-12 DIAGNOSIS — Z7189 Other specified counseling: Secondary | ICD-10-CM | POA: Diagnosis not present

## 2017-10-12 DIAGNOSIS — I739 Peripheral vascular disease, unspecified: Secondary | ICD-10-CM

## 2017-10-12 NOTE — Progress Notes (Signed)
Subjective:    Patient ID: Sean [name], male    DOB: [birth year], 77 y.o.   MRN: [id number]  HPI Here for Medicare wellness visit and follow up of chronic health conditions Reviewed form and advanced directives Reviewed other doctors Still smokes --not willing to stop  Rare drink of alcohol Hearing and vision are good Walks regularly No falls No depression or anhedonia Independent with instrumental ADLs Mild memory issues--nothing worrisome  He has no new concerns Some DOE if he pushes it--stable exercise tolerance No regular cough or wheezing  No recent leg pain Some right hip pain though if he goes for a while---then better with rest Keeps up with Dr Fletcher Anon No chest pain  no palpitations No dizziness or syncope No edema  Current Outpatient Medications on File Prior to Visit  Medication Sig Dispense Refill  . amLODipine (NORVASC) 5 MG tablet TAKE 1 TABLET (5 MG TOTAL) BY MOUTH DAILY. 90 tablet 3  . clopidogrel (PLAVIX) 75 MG tablet TAKE 1 TABLET (75 MG TOTAL) BY MOUTH DAILY. 90 tablet 3  . hydrochlorothiazide (HYDRODIURIL) 25 MG tablet TAKE 1 TABLET (25 MG TOTAL) BY MOUTH DAILY. 90 tablet 3  . rosuvastatin (CRESTOR) 40 MG tablet Take 1 tablet (40 mg total) by mouth daily. 90 tablet 3   No current facility-administered medications on file prior to visit.     No Known Allergies  Past Medical History:  Diagnosis Date  . Arthritis   . Cervical disc disease   . COPD (chronic obstructive pulmonary disease) ([place])   . ED (erectile dysfunction)   . Hyperlipidemia   . PAD (peripheral artery disease) ([place]) 02/05/2013   05/14: Subtotally occluded right external iliac artery, long occlusion of the left proximal to mid SFA with 1 vessel runoff below the knee, diffusely diseased right SFA with short occlusion in the midsegment with three-vessel runoff below the knee. Status post self-expanding stent placement to the right external iliac artery.  . Peptic ulcer   . Substance  abuse [facility])     Past Surgical History:  Procedure Laterality Date  . ABDOMINAL AORTAGRAM N/A 02/13/2013   Procedure: ABDOMINAL Maxcine Ham;  Surgeon: Wellington Hampshire, MD;  Location: [place] CATH LAB;  Service: Cardiovascular;  Laterality: N/A;  . CERVICAL [place]  . HARVEST BONE MARROW     Donor  . ILIAC ARTERY STENT     right stent placement  . LOWER EXTREMITY ANGIOGRAM  02/13/2013   Procedure: LOWER EXTREMITY ANGIOGRAM;  Surgeon: Wellington Hampshire, MD;  Location: [place] CATH LAB;  Service: Cardiovascular;;  . PERCUTANEOUS STENT INTERVENTION Right 02/13/2013   Procedure: PERCUTANEOUS STENT INTERVENTION;  Surgeon: Wellington Hampshire, MD;  Location: [place] CATH LAB;  Service: Cardiovascular;  Laterality: Right;  rt ext iliac stent  . STOMACH SURGERY  1974   gastric ulcer    Family History  Problem Relation Age of Onset  . Clotting disorder Mother        blood clot  . Heart disease Father   . Arthritis Sister   . Leukemia Son        bone marrow transplant  . Hypertension Neg Hx   . Colon cancer Neg Hx   . Stomach cancer Neg Hx     Social History   Socioeconomic History  . Marital status: Married    Spouse name: Not on file  . Number of children: 7  . Years of education: Not on file  . Highest education level: Not on file  Social Needs  . Financial resource strain: Not on file  . Food insecurity - worry: Not on file  . Food insecurity - inability: Not on file  . Transportation needs - medical: Not on file  . Transportation needs - non-medical: Not on file  Occupational History  . Occupation: retired Games developer   Tobacco Use  . Smoking status: Current Every Day Smoker    Packs/day: 0.25    Years: 54.00    Pack years: 13.50    Types: Cigarettes  . Smokeless tobacco: Never Used  Substance and Sexual Activity  . Alcohol use: No    Comment: rarely  . Drug use: Yes    Frequency: 7.0 times per week    Types: Marijuana  . Sexual activity: Not on file  Other Topics Concern  .  Not on file  Social History Narrative   No living will   Requests that wife make medical decisions for him if he is unable   Would accept CPR.    Would accept mechanical ventilation.    Would accept feeding tube   Review of Systems Appetite is good Weight is stable Sleeps well--but usually only 5 hours at night plus a nap Wears seat belt Full dentures No rash or suspicious skin lesions No other back or joint pains Bowels are fine--no blood Voids fine. Nocturia x 1 No heartburn or dysphagia    Objective:   Physical Exam  Constitutional: He is oriented to person, place, and time. He appears well-developed. No distress.  HENT:  Mouth/Throat: Oropharynx is clear and moist. No oropharyngeal exudate.  Neck: No thyromegaly present.  Cardiovascular: Normal rate, regular rhythm and normal heart sounds. Exam reveals no gallop.  No murmur heard. Very faint pedal pulses  Pulmonary/Chest: Effort normal. No respiratory distress. He has no wheezes. He has no rales.  Slightly decreased breath sounds  Abdominal: Soft. There is no tenderness.  Musculoskeletal: He exhibits no edema or tenderness.  Lymphadenopathy:    He has no cervical adenopathy.  Neurological: He is alert and oriented to person, place, and time.  President--- "Sandy Salaam, Obama, [place]" [phone] Not good at spelling Recall 3/3  Skin: No rash noted. No erythema.  Psychiatric: He has a normal mood and affect. His behavior is normal.          Assessment & Plan:

## 2017-10-12 NOTE — Assessment & Plan Note (Signed)
I have personally reviewed the Medicare Annual Wellness questionnaire and have noted 1. The patient's medical and social history 2. Their use of alcohol, tobacco or illicit drugs 3. Their current medications and supplements 4. The patient's functional ability including ADL's, fall risks, home safety risks and hearing or visual             impairment. 5. Diet and physical activities 6. Evidence for depression or mood disorders  The patients weight, height, BMI and visual acuity have been recorded in the chart I have made referrals, counseling and provided education to the patient based review of the above and I have provided the pt with a written personalized care plan for preventive services.  I have provided you with a copy of your personalized plan for preventive services. Please take the time to review along with your updated medication list.  Will reconsider colon in 3 years No PSA due to age Discussed flu, pneumonia and Td vaccines----he still prefers none Won't stop smoking

## 2017-10-12 NOTE — Assessment & Plan Note (Signed)
See social history 

## 2017-10-12 NOTE — Progress Notes (Signed)
Hearing Screening   125Hz  250Hz  500Hz  1000Hz  2000Hz  3000Hz  4000Hz  6000Hz  8000Hz   Right ear:   20 20 20  20     Left ear:   20 20 20   40      Visual Acuity Screening   Right eye Left eye Both eyes  Without correction: 20/50 20/30 20/30   With correction:

## 2017-10-12 NOTE — Assessment & Plan Note (Signed)
Mild symptoms No Rx Won't stop cigarettes

## 2017-10-12 NOTE — Assessment & Plan Note (Signed)
No symptoms 

## 2017-10-12 NOTE — Assessment & Plan Note (Signed)
Stable claudication On plavix and statin

## 2017-10-13 LAB — LIPID PANEL
Cholesterol: 129 mg/dL (ref 0–200)
HDL: 37 mg/dL — AB (ref >39.00)
LDL Cholesterol: 59 mg/dL (ref 0–99)
NonHDL: 91.99
TRIGLYCERIDES: 166 mg/dL — AB (ref 0.0–149.0)
Total CHOL/HDL Ratio: 3
VLDL: 33.2 mg/dL (ref 0.0–40.0)

## 2017-10-13 LAB — COMPREHENSIVE METABOLIC PANEL
ALT: 12 U/L (ref 0–53)
AST: 13 U/L (ref 0–37)
Albumin: 4.3 g/dL (ref 3.5–5.2)
Alkaline Phosphatase: 77 U/L (ref 39–117)
BILIRUBIN TOTAL: 0.4 mg/dL (ref 0.2–1.2)
BUN: 20 mg/dL (ref 6–23)
CALCIUM: 9.7 mg/dL (ref 8.4–10.5)
CHLORIDE: 107 meq/L (ref 96–112)
CO2: 27 mEq/L (ref 19–32)
Creatinine, Ser: 1.55 mg/dL — ABNORMAL HIGH (ref 0.40–1.50)
GFR: 56.45 mL/min — AB (ref >60.00)
Glucose, Bld: 125 mg/dL — ABNORMAL HIGH (ref 70–99)
POTASSIUM: 3.7 meq/L (ref 3.5–5.1)
Sodium: 143 mEq/L (ref 135–145)
Total Protein: 7.3 g/dL (ref 6.0–8.3)

## 2017-10-13 LAB — CBC
HEMATOCRIT: 37.9 % — AB (ref 39.0–52.0)
HEMOGLOBIN: 12.6 g/dL — AB (ref 13.0–17.0)
MCHC: 33.1 g/dL (ref 30.0–36.0)
MCV: 83.4 fl (ref 78.0–100.0)
Platelets: 296 10*3/uL (ref 150.0–400.0)
RBC: 4.55 Mil/uL (ref 4.22–5.81)
RDW: 16 % — ABNORMAL HIGH (ref 11.5–15.5)
WBC: 6.7 10*3/uL (ref 4.0–10.5)

## 2017-11-02 ENCOUNTER — Other Ambulatory Visit: Payer: Self-pay | Admitting: *Deleted

## 2017-11-02 MED ORDER — ROSUVASTATIN CALCIUM 40 MG PO TABS: 40.0000 mg | ORAL_TABLET | Freq: Every day | ORAL | 3 refills | Status: DC

## 2017-11-03 ENCOUNTER — Telehealth: Payer: Self-pay | Admitting: Cardiovascular Disease

## 2017-11-03 ENCOUNTER — Telehealth: Payer: Self-pay

## 2017-11-03 NOTE — Telephone Encounter (Signed)
Fax received from "third party request" stating the patient is paying $30 for rosuvastatin and that he is requesting a lower out of pocket cost- alternative listed was atorvastatin fr $15, but patient had previously been on this in the past with LDL not at goal.  Dr. Silvio Pate has been following his lipids most recently.

## 2017-11-03 NOTE — Telephone Encounter (Signed)
Error

## 2017-12-14 ENCOUNTER — Other Ambulatory Visit: Payer: Self-pay | Admitting: Cardiovascular Disease

## 2018-03-13 ENCOUNTER — Other Ambulatory Visit: Payer: Self-pay | Admitting: Cardiovascular Disease

## 2018-04-25 ENCOUNTER — Other Ambulatory Visit: Payer: Self-pay | Admitting: Cardiovascular Disease

## 2018-04-25 DIAGNOSIS — I739 Peripheral vascular disease, unspecified: Secondary | ICD-10-CM

## 2018-05-09 ENCOUNTER — Ambulatory Visit (INDEPENDENT_AMBULATORY_CARE_PROVIDER_SITE_OTHER): Payer: Medicare Other

## 2018-05-09 DIAGNOSIS — I739 Peripheral vascular disease, unspecified: Secondary | ICD-10-CM | POA: Diagnosis not present

## 2018-06-06 ENCOUNTER — Other Ambulatory Visit: Payer: Self-pay | Admitting: Cardiovascular Disease

## 2018-06-07 ENCOUNTER — Telehealth: Payer: Self-pay

## 2018-06-07 NOTE — Telephone Encounter (Signed)
Copied from [place] [phone]. Topic: General - Other >> Jun 07, 2018  1:18 PM Mcneil, Ja-Kwan wrote: Reason for CRM: Pt states he lost the Rx for amLODipine (NORVASC) 5 MG tablet and he would need another Rx sent to his pharmacy. Pt requests call back. Cb# [phone]

## 2018-06-08 NOTE — Telephone Encounter (Signed)
Refill sent to pharmacy 06/07/18 pt needs to schedule appointment for further refills.

## 2018-06-29 ENCOUNTER — Other Ambulatory Visit: Payer: Self-pay | Admitting: Cardiovascular Disease

## 2018-07-05 ENCOUNTER — Telehealth: Payer: Self-pay | Admitting: Cardiovascular Disease

## 2018-07-05 NOTE — Telephone Encounter (Signed)
Patient is now scheduled for 08/01/18 to see Sean [name]

## 2018-07-05 NOTE — Telephone Encounter (Signed)
-----   Message from [name], [name] sent at 06/29/2018  2:13 PM EDT ----- Please contact patient for a follow up appointment.  The patient is requesting refills on meds.  Thanks, [name]

## 2018-07-27 NOTE — Progress Notes (Signed)
Cardiology Office Note Date:  08/01/2018  Patient ID:  Deagan, Sevin September 23, 1940, MRN [id number] PCP:  [name], MD  Cardiologist:  Dr. Fletcher Anon, MD    Chief Complaint: Follow up  History of Present Illness: AYOMIDE PURDY is a 77 y.o. male with history of PAD as outlined below, slightly abnormal stress test, CKD stage II, hypertension, hyperlipidemia, and tobacco abuse who presents for follow-up of his PAD.  Patient is status post right external iliac artery stenting in 01/2013 for severe claudication.  He is known to have a long occlusion of the left SFA and severe diffuse disease of the right SFA that has been medically managed.  Prior nuclear stress test in 2014 showed evidence of a small inferior infarct without significant ischemia, EF 52%.  He was last seen in the office in 04/2017 for routine follow-up and was doing well, denying any chest pain, shortness of breath, or leg claudication.  He continued to smoke approximately 1 pack daily.  Vascular studies in 04/2017 showed a stable ABI close to 0.7 bilaterally with patent right external iliac stent.  Most recent ABIs from 04/2018 appeared essentially unchanged from prior study in 04/2017 with a right ABI 0.66 (prior 0.68), left ABI 0.71 (prior 0.73), with patent right external iliac stent.  Labs: 09/2017 - LDL 59, serum creatinine 1.55, potassium 3.7, LFT normal, Hgb 12.6  He comes in doing well today. No chest pain or SOB. No symptoms of leg claudication. He does note some hip soreness if he ambulates for an extended time. He continues to smoke 1/2 pack daily and is not interested in quitting. He checks his BP at local stores with readings being "good." He has not taken his medications yet today. He typically takes them around 10 AM. No falls since he was last seen. No BRBPR or melena. No wounds on his feet. He does not have any concerns at this time.    Past Medical History:  Diagnosis Date  . Arthritis   . Cervical disc  disease   . COPD (chronic obstructive pulmonary disease) ([place])   . ED (erectile dysfunction)   . Hyperlipidemia   . PAD (peripheral artery disease) ([place]) 02/05/2013   05/14: Subtotally occluded right external iliac artery, long occlusion of the left proximal to mid SFA with 1 vessel runoff below the knee, diffusely diseased right SFA with short occlusion in the midsegment with three-vessel runoff below the knee. Status post self-expanding stent placement to the right external iliac artery.  . Peptic ulcer   . Substance abuse [facility])     Past Surgical History:  Procedure Laterality Date  . ABDOMINAL AORTAGRAM N/A 02/13/2013   Procedure: ABDOMINAL Maxcine Ham;  Surgeon: Wellington Hampshire, MD;  Location: [place] CATH LAB;  Service: Cardiovascular;  Laterality: N/A;  . CERVICAL [place]  . HARVEST BONE MARROW     Donor  . ILIAC ARTERY STENT     right stent placement  . LOWER EXTREMITY ANGIOGRAM  02/13/2013   Procedure: LOWER EXTREMITY ANGIOGRAM;  Surgeon: Wellington Hampshire, MD;  Location: [place] CATH LAB;  Service: Cardiovascular;;  . PERCUTANEOUS STENT INTERVENTION Right 02/13/2013   Procedure: PERCUTANEOUS STENT INTERVENTION;  Surgeon: Wellington Hampshire, MD;  Location: [facility] CATH LAB;  Service: Cardiovascular;  Laterality: Right;  rt ext iliac stent  . STOMACH SURGERY  1974   gastric ulcer    Current Meds  Medication Sig  . amLODipine (NORVASC) 5 MG tablet TAKE 1 TABLET BY  MOUTH EVERY DAY  . clopidogrel (PLAVIX) 75 MG tablet TAKE 1 TABLET (75 MG TOTAL) BY MOUTH DAILY.  . hydrochlorothiazide (HYDRODIURIL) 25 MG tablet TAKE 1 TABLET (25 MG TOTAL) BY MOUTH DAILY.    Allergies:   Patient has no known allergies.   Social History:  The patient  reports that he has been smoking cigarettes. He has a 13.50 pack-year smoking history. He has never used smokeless tobacco. He reports that he has current or past drug history. Drug: Marijuana. Frequency: 7.00 times per week. He reports that he does not drink  alcohol.   Family History:  The patient's family history includes Arthritis in his sister; Clotting disorder in his mother; Heart disease in his father; Leukemia in his son.  ROS:   Review of Systems  Constitutional: Negative for chills, diaphoresis, fever, malaise/fatigue and weight loss.  HENT: Negative for congestion.   Eyes: Negative for discharge and redness.  Respiratory: Negative for cough, hemoptysis, sputum production, shortness of breath and wheezing.   Cardiovascular: Negative for chest pain, palpitations, orthopnea, claudication, leg swelling and PND.  Gastrointestinal: Negative for abdominal pain, blood in stool, heartburn, melena, nausea and vomiting.  Genitourinary: Negative for hematuria.  Musculoskeletal: Positive for joint pain. Negative for falls and myalgias.       Right hip  Skin: Negative for rash.  Neurological: Negative for dizziness, tingling, tremors, sensory change, speech change, focal weakness, loss of consciousness and weakness.  Endo/Heme/Allergies: Does not bruise/bleed easily.  Psychiatric/Behavioral: Negative for substance abuse. The patient is not nervous/anxious.   All other systems reviewed and are negative.    PHYSICAL EXAM:  VS:  BP (!) 148/60 (BP Location: Left Arm, Patient Position: Sitting, Cuff Size: Normal) Comment: Without medications  Pulse 70   Ht 5\' 7"  (1.702 m)   Wt 165 lb 8 oz (75.1 kg)   BMI 25.92 kg/m  BMI: Body mass index is 25.92 kg/m.  Physical Exam  Constitutional: He is oriented to person, place, and time. He appears well-developed and well-nourished.  HENT:  Head: Normocephalic and atraumatic.  Eyes: Right eye exhibits no discharge. Left eye exhibits no discharge.  Neck: Normal range of motion. No JVD present.  Cardiovascular: Normal rate, regular rhythm, S1 normal, S2 normal and normal heart sounds. Exam reveals no distant heart sounds, no friction rub, no midsystolic click and no opening snap.  No murmur  heard. Pulses:      Dorsalis pedis pulses are 1+ on the right side, and 1+ on the left side.       Posterior tibial pulses are 1+ on the right side, and 1+ on the left side.  Pulmonary/Chest: Effort normal and breath sounds normal. No respiratory distress. He has no decreased breath sounds. He has no wheezes. He has no rales. He exhibits no tenderness.  Abdominal: Soft. He exhibits no distension. There is no tenderness.  Musculoskeletal: He exhibits no edema.  Neurological: He is alert and oriented to person, place, and time.  Skin: Skin is warm and dry. No cyanosis. Nails show no clubbing.  Psychiatric: He has a normal mood and affect. His speech is normal and behavior is normal. Judgment and thought content normal.     EKG:  Was ordered and interpreted by me today. Shows NSR, 70 bpm, left axis deviation, left anterior fascicular block, prior inferior infarct, low voltage through the precordial leads, no acute st/t changes (unchanged from prior study)  Recent Labs: 10/12/2017: ALT 12; BUN 20; Creatinine, Ser 1.55; Hemoglobin 12.6; Platelets  296.0; Potassium 3.7; Sodium 143  10/12/2017: Cholesterol 129; HDL 37.00; LDL Cholesterol 59; Total CHOL/HDL Ratio 3; Triglycerides 166.0; VLDL 33.2   CrCl cannot be calculated (Patient's most recent lab result is older than the maximum 21 days allowed.).   Wt Readings from Last 3 Encounters:  08/01/18 165 lb 8 oz (75.1 kg)  10/12/17 172 lb 4 oz (78.1 kg)  05/05/17 161 lb 12 oz (73.4 kg)     Other studies reviewed: Additional studies/records reviewed today include: summarized above  ASSESSMENT AND PLAN:  1. PAD: Doing well without any symptoms of claudication. Most recent lower extremity noninvasive imaging showed stable bilateral ABIs with a patent right external iliac artery stent. Continue Plavix and Crestor. Follow up lower extremity ultrasound/ABI in 04/2019. Risk factor modification.   2. 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 He is doing well and is without symptoms concerning for angina. Continue Plavix as above along with Crestor. Aggressive risk factor modification including smoking cessation is advised.   3. HLD: LDL from 09/2017 of 59 with normal LFT at that time. Continue Crestor 40 mg daily.   4. HTN: Blood pressure is suboptimally controlled this morning, though he has not yet taken his amlodipine or HCTZ. He reports "good" BP at local stores. He will continue to monitor his BP and contact us if readings remain in the 568L systolic.   5. CKD stage II: Check BMET.   6. Tobacco abuse: Complete cessation is advised. Patient is not interested in quitting. Advised him to contact us or his PCP when he is ready.   7. Anemia: Check CBC.   Disposition: F/u with Dr. Fletcher Anon or an APP in 04/2019.  Current medicines are reviewed at length with the patient today.  The patient did not have any concerns regarding medicines.  Signed, Christell Faith, PA-C 08/01/2018 9:05 AM     [facility] [place] [address] [place] [address] [place],  [address] [phone]

## 2018-08-01 ENCOUNTER — Ambulatory Visit (INDEPENDENT_AMBULATORY_CARE_PROVIDER_SITE_OTHER): Payer: Medicare Other | Admitting: Physician Assistant

## 2018-08-01 ENCOUNTER — Encounter: Payer: Self-pay | Admitting: Physician Assistant

## 2018-08-01 VITALS — BP 148/60 | HR 70 | Ht 67.0 in | Wt 165.5 lb

## 2018-08-01 DIAGNOSIS — Z720 Tobacco use: Secondary | ICD-10-CM | POA: Diagnosis not present

## 2018-08-01 DIAGNOSIS — E785 Hyperlipidemia, unspecified: Secondary | ICD-10-CM

## 2018-08-01 DIAGNOSIS — N182 Chronic kidney disease, stage 2 (mild): Secondary | ICD-10-CM

## 2018-08-01 DIAGNOSIS — I739 Peripheral vascular disease, unspecified: Secondary | ICD-10-CM

## 2018-08-01 DIAGNOSIS — I10 Essential (primary) hypertension: Secondary | ICD-10-CM | POA: Diagnosis not present

## 2018-08-01 DIAGNOSIS — I2510 Atherosclerotic heart disease of native coronary artery without angina pectoris: Secondary | ICD-10-CM

## 2018-08-01 MED ORDER — AMLODIPINE BESYLATE 5 MG PO TABS: 5.0000 mg | ORAL_TABLET | Freq: Every day | ORAL | 3 refills | Status: DC

## 2018-08-01 MED ORDER — HYDROCHLOROTHIAZIDE 25 MG PO TABS: 25.0000 mg | ORAL_TABLET | Freq: Every day | ORAL | 3 refills | Status: DC

## 2018-08-01 MED ORDER — CLOPIDOGREL BISULFATE 75 MG PO TABS: 75.0000 mg | ORAL_TABLET | Freq: Every day | ORAL | 3 refills | Status: DC

## 2018-08-01 NOTE — Patient Instructions (Signed)
Medication Instructions:  No changes  If you need a refill on your cardiac medications before your next appointment, please call your pharmacy.   Lab work: Your provider would like for you to have the following labs today: CBC and BMET  If you have labs (blood work) drawn today and your tests are completely normal, you will receive your results only by: Marland Kitchen MyChart Message (if you have MyChart) OR . A paper copy in the mail If you have any lab test that is abnormal or we need to change your treatment, we will call you to review the results.  Testing/Procedures: Your physician has requested that you have a lower extremity arterial duplex in August 2020. During this test, ultrasound is used to evaluate arterial blood flow in the legs. Allow one hour for this exam. There are no restrictions or special instructions. This will take place at [place] [address], the [facility] office.  Your physician has requested that you have an ankle brachial index (ABI)in August 2020. During this test an ultrasound and blood pressure cuff are used to evaluate the arteries that supply the arms and legs with blood. Allow thirty minutes for this exam. There are no restrictions or special instructions. This will take place at [address], the [facility] office.  Follow-Up: At [facility], you and your health needs are our priority.  As part of our continuing mission to provide you with exceptional heart care, we have created designated Provider Care Teams.  These Care Teams include your primary Cardiologist (physician) and Advanced Practice Providers (APPs -  Physician Assistants and Nurse Practitioners) who all work together to provide you with the care you need, when you need it. You will need a follow up appointment in August 2020 after the dopplers.  Please call our office 2 months in advance to schedule this appointment.  You may see Dr. Fletcher Anon or one of the following Advanced  Practice Providers on your designated Care Team:   Murray Hodgkins, NP Christell Faith, PA-C . Marrianne Mood, PA-C

## 2018-08-03 ENCOUNTER — Telehealth: Payer: Self-pay | Admitting: *Deleted

## 2018-08-03 DIAGNOSIS — Z79899 Other long term (current) drug therapy: Secondary | ICD-10-CM

## 2018-08-03 DIAGNOSIS — I2510 Atherosclerotic heart disease of native coronary artery without angina pectoris: Secondary | ICD-10-CM

## 2018-08-03 LAB — BASIC METABOLIC PANEL
BUN/Creatinine Ratio: 8 — ABNORMAL LOW (ref 10–24)
BUN: 13 mg/dL (ref 8–27)
CALCIUM: 9.5 mg/dL (ref 8.6–10.2)
CO2: 22 mmol/L (ref 20–29)
CREATININE: 1.57 mg/dL — AB (ref 0.76–1.27)
Chloride: 103 mmol/L (ref 96–106)
GFR, EST AFRICAN AMERICAN: 49 mL/min/{1.73_m2} — AB (ref >59)
GFR, EST NON AFRICAN AMERICAN: 42 mL/min/{1.73_m2} — AB (ref >59)
Glucose: 103 mg/dL — ABNORMAL HIGH (ref 65–99)
Potassium: 5 mmol/L (ref 3.5–5.2)
Sodium: 141 mmol/L (ref 134–144)

## 2018-08-03 LAB — CBC
HEMATOCRIT: 38.2 % (ref 37.5–51.0)
Hemoglobin: 12.3 g/dL — ABNORMAL LOW (ref 13.0–17.7)
MCH: 27.9 pg (ref 26.6–33.0)
MCHC: 32.2 g/dL (ref 31.5–35.7)
MCV: 87 fL (ref 79–97)
Platelets: 282 10*3/uL (ref 150–450)
RBC: 4.41 x10E6/uL (ref 4.14–5.80)
RDW: 15.5 % — AB (ref 12.3–15.4)
WBC: 6.1 10*3/uL (ref 3.4–10.8)

## 2018-08-03 NOTE — Telephone Encounter (Signed)
-----   Message from Rise Mu, PA-C sent at 08/03/2018  7:08 AM EST ----- HGB low, though stable. Recommend he follow up with PCP for anemia as directed.  Renal function is elevated, though appears to be at his baseline.  Potassium is high end of normal.  Make sure he is avoiding seasonings that contain potassium.   Recommend recheck potassium in 1 week.

## 2018-08-03 NOTE — Telephone Encounter (Signed)
Results called to pt. Pt verbalized understanding of results and to contact PCP about anemia. He is aware to go to the [facility] next Friday for lab work.

## 2018-08-10 ENCOUNTER — Other Ambulatory Visit
Admission: RE | Admit: 2018-08-10 | Discharge: 2018-08-10 | Disposition: A | Discharge status: Home / Self Care | Payer: Medicare Other | Source: Ambulatory Visit | Attending: Physician Assistant | Admitting: Physician Assistant

## 2018-08-10 DIAGNOSIS — Z79899 Other long term (current) drug therapy: Secondary | ICD-10-CM | POA: Diagnosis not present

## 2018-08-10 DIAGNOSIS — I2510 Atherosclerotic heart disease of native coronary artery without angina pectoris: Secondary | ICD-10-CM | POA: Diagnosis not present

## 2018-08-10 LAB — BASIC METABOLIC PANEL
Anion gap: 7 (ref 5–15)
BUN: 20 mg/dL (ref 8–23)
CHLORIDE: 106 mmol/L (ref 98–111)
CO2: 25 mmol/L (ref 22–32)
CREATININE: 1.41 mg/dL — AB (ref 0.61–1.24)
Calcium: 9.2 mg/dL (ref 8.9–10.3)
GFR calc Af Amer: 56 mL/min — ABNORMAL LOW (ref >60)
GFR calc non Af Amer: 48 mL/min — ABNORMAL LOW (ref >60)
GLUCOSE: 106 mg/dL — AB (ref 70–99)
POTASSIUM: 4.2 mmol/L (ref 3.5–5.1)
Sodium: 138 mmol/L (ref 135–145)

## 2018-10-18 ENCOUNTER — Ambulatory Visit (INDEPENDENT_AMBULATORY_CARE_PROVIDER_SITE_OTHER): Payer: Medicare Other | Admitting: Internal Medicine

## 2018-10-18 ENCOUNTER — Encounter: Payer: Self-pay | Admitting: Internal Medicine

## 2018-10-18 VITALS — BP 134/70 | HR 96 | Temp 98.1°F | Ht 67.0 in | Wt 168.0 lb

## 2018-10-18 DIAGNOSIS — Z7189 Other specified counseling: Secondary | ICD-10-CM

## 2018-10-18 DIAGNOSIS — J449 Chronic obstructive pulmonary disease, unspecified: Secondary | ICD-10-CM | POA: Diagnosis not present

## 2018-10-18 DIAGNOSIS — I10 Essential (primary) hypertension: Secondary | ICD-10-CM | POA: Diagnosis not present

## 2018-10-18 DIAGNOSIS — I739 Peripheral vascular disease, unspecified: Secondary | ICD-10-CM | POA: Diagnosis not present

## 2018-10-18 DIAGNOSIS — Z0000 Encounter for general adult medical examination without abnormal findings: Secondary | ICD-10-CM | POA: Diagnosis not present

## 2018-10-18 DIAGNOSIS — N183 Chronic kidney disease, stage 3 unspecified: Secondary | ICD-10-CM

## 2018-10-18 DIAGNOSIS — N1831 Chronic kidney disease, stage 3a: Secondary | ICD-10-CM | POA: Insufficient documentation

## 2018-10-18 DIAGNOSIS — N1832 Chronic kidney disease, stage 3b: Secondary | ICD-10-CM | POA: Insufficient documentation

## 2018-10-18 MED ORDER — ROSUVASTATIN CALCIUM 40 MG PO TABS: 40.0000 mg | ORAL_TABLET | Freq: Every day | ORAL | 3 refills | Status: DC

## 2018-10-18 MED ORDER — AMLODIPINE BESYLATE 5 MG PO TABS: 5.0000 mg | ORAL_TABLET | Freq: Every day | ORAL | 3 refills | Status: DC

## 2018-10-18 NOTE — Assessment & Plan Note (Signed)
Mild Not willing to stop smoking but has cut down some

## 2018-10-18 NOTE — Assessment & Plan Note (Signed)
BP Readings from Last 3 Encounters:  10/18/18 134/70  08/01/18 (!) 148/60  10/12/17 140/68   Adequate control

## 2018-10-18 NOTE — Progress Notes (Signed)
Subjective:    Patient ID: DEMPSY [name], male    DOB: 24-Nov-1940, 78 y.o.   MRN: [id number]  HPI Here for Medicare wellness visit and follow up of chronic health conditions Reviewed advanced directives  Reviewed other doctors---[facility]--cardiology, [facility]--GI. No dentist or eye doctor Still smokes--doesn't want to stop. Has cut down Rare drink of alcohol Vision and hearing are okay. No falls No depression or anhedonia No hospitalizations or surgery in past year Independent with instrumental ADLs No problems with memory Some exercise  No claudication Walks at times--or uses [facility] Vascular evaluation every August--- ABIs were stable  Stable DOE Some cough--at night  No chest pain No palpitations No dizziness or syncope No edema  Reviewed labs GFR in 50's  Current Outpatient Medications on File Prior to Visit  Medication Sig Dispense Refill  . amLODipine (NORVASC) 5 MG tablet Take 1 tablet (5 mg total) by mouth daily. 90 tablet 3  . clopidogrel (PLAVIX) 75 MG tablet Take 1 tablet (75 mg total) by mouth daily. 90 tablet 3  . hydrochlorothiazide (HYDRODIURIL) 25 MG tablet Take 1 tablet (25 mg total) by mouth daily. 90 tablet 3  . rosuvastatin (CRESTOR) 40 MG tablet Take 1 tablet (40 mg total) by mouth daily. 90 tablet 3   No current facility-administered medications on file prior to visit.     No Known Allergies  Past Medical History:  Diagnosis Date  . Arthritis   . Cervical disc disease   . COPD (chronic obstructive pulmonary disease) ([place])   . ED (erectile dysfunction)   . Hyperlipidemia   . PAD (peripheral artery disease) ([place]) 02/05/2013   05/14: Subtotally occluded right external iliac artery, long occlusion of the left proximal to mid SFA with 1 vessel runoff below the knee, diffusely diseased right SFA with short occlusion in the midsegment with three-vessel runoff below the knee. Status post self-expanding stent placement to the right external  iliac artery.  . Peptic ulcer   . Substance abuse [facility])     Past Surgical History:  Procedure Laterality Date  . ABDOMINAL AORTAGRAM N/A 02/13/2013   Procedure: ABDOMINAL Maxcine Ham;  Surgeon: Wellington Hampshire, MD;  Location: [place] CATH LAB;  Service: Cardiovascular;  Laterality: N/A;  . CERVICAL [place]  . HARVEST BONE MARROW     Donor  . ILIAC ARTERY STENT     right stent placement  . LOWER EXTREMITY ANGIOGRAM  02/13/2013   Procedure: LOWER EXTREMITY ANGIOGRAM;  Surgeon: Wellington Hampshire, MD;  Location: [place] CATH LAB;  Service: Cardiovascular;;  . PERCUTANEOUS STENT INTERVENTION Right 02/13/2013   Procedure: PERCUTANEOUS STENT INTERVENTION;  Surgeon: Wellington Hampshire, MD;  Location: [place] CATH LAB;  Service: Cardiovascular;  Laterality: Right;  rt ext iliac stent  . STOMACH SURGERY  1974   gastric ulcer    Family History  Problem Relation Age of Onset  . Clotting disorder Mother        blood clot  . Heart disease Father   . Arthritis Sister   . Leukemia Son        bone marrow transplant  . Hypertension Neg Hx   . Colon cancer Neg Hx   . Stomach cancer Neg Hx     Social History   Socioeconomic History  . Marital status: Married    Spouse name: Not on file  . Number of children: 7  . Years of education: Not on file  . Highest education level: Not on file  Occupational History  .  Occupation: retired Restaurant manager, fast food  . Financial resource strain: Not on file  . Food insecurity:    Worry: Not on file    Inability: Not on file  . Transportation needs:    Medical: Not on file    Non-medical: Not on file  Tobacco Use  . Smoking status: Current Every Day Smoker    Packs/day: 0.25    Years: 54.00    Pack years: 13.50    Types: Cigarettes  . Smokeless tobacco: Never Used  Substance and Sexual Activity  . Alcohol use: No    Comment: rarely  . Drug use: Yes    Frequency: 7.0 times per week    Types: Marijuana  . Sexual activity: Not on file  Lifestyle  .  Physical activity:    Days per week: Not on file    Minutes per session: Not on file  . Stress: Not on file  Relationships  . Social connections:    Talks on phone: Not on file    Gets together: Not on file    Attends religious service: Not on file    Active member of club or organization: Not on file    Attends meetings of clubs or organizations: Not on file    Relationship status: Not on file  . Intimate partner violence:    Fear of current or ex partner: Not on file    Emotionally abused: Not on file    Physically abused: Not on file    Forced sexual activity: Not on file  Other Topics Concern  . Not on file  Social History Narrative   No living will   Requests that wife make medical decisions for him if he is unable   Would accept CPR.    Would accept mechanical ventilation.    Would accept feeding tube   Review of Systems Full dentures--no problems Appetite is good---weight stable Sleeps well Wears seat belt Voids fine---stream is fine Bowels are okay--no blood. Tend to be loose--but only once No heartburn or dysphagia No sig back or joint pain No rash or suspicious skin lesions    Objective:   Physical Exam  Constitutional: He is oriented to person, place, and time. He appears well-developed. No distress.  HENT:  Mouth/Throat: Oropharynx is clear and moist. No oropharyngeal exudate.  Neck: No thyromegaly present.  Cardiovascular: Normal rate, regular rhythm and normal heart sounds. Exam reveals no gallop.  No murmur heard. Feet warm but no pulses  Respiratory: Effort normal. No respiratory distress. He has no wheezes. He has no rales.  Slightly decreased breath sounds but clear   GI: Soft. There is no abdominal tenderness.  Musculoskeletal:        General: No tenderness or edema.  Lymphadenopathy:    He has no cervical adenopathy.  Neurological: He is alert and oriented to person, place, and time.  President--- "I don't like saying it, Obama,  Bush" [id number] Doesn't do spelling Recall 3/3  Skin: No rash noted. No erythema.  Psychiatric: He has a normal mood and affect. His behavior is normal.           Assessment & Plan:

## 2018-10-18 NOTE — Assessment & Plan Note (Signed)
See social history 

## 2018-10-18 NOTE — Assessment & Plan Note (Signed)
I have personally reviewed the Medicare Annual Wellness questionnaire and have noted 1. The patient's medical and social history 2. Their use of alcohol, tobacco or illicit drugs 3. Their current medications and supplements 4. The patient's functional ability including ADL's, fall risks, home safety risks and hearing or visual             impairment. 5. Diet and physical activities 6. Evidence for depression or mood disorders  The patients weight, height, BMI and visual acuity have been recorded in the chart I have made referrals, counseling and provided education to the patient based review of the above and I have provided the pt with a written personalized care plan for preventive services.  I have provided you with a copy of your personalized plan for preventive services. Please take the time to review along with your updated medication list.  Prefers no immunizations Recommended at least Td booster if injury Consider colonoscopy again next year No PSA due to age

## 2018-10-18 NOTE — Assessment & Plan Note (Signed)
Mild Would not change his meds for now

## 2018-10-18 NOTE — Progress Notes (Signed)
Hearing Screening   Method: Audiometry   125Hz  250Hz  500Hz  1000Hz  2000Hz  3000Hz  4000Hz  6000Hz  8000Hz   Right ear:   25 25 20   0    Left ear:   25 25 20   0      Visual Acuity Screening   Right eye Left eye Both eyes  Without correction: 20/50 20/25 20/25   With correction:

## 2018-10-18 NOTE — Assessment & Plan Note (Signed)
Stable blockages No action needed Yearly follow up

## 2019-06-13 ENCOUNTER — Other Ambulatory Visit: Payer: Self-pay | Admitting: Physician Assistant

## 2019-06-13 ENCOUNTER — Ambulatory Visit (INDEPENDENT_AMBULATORY_CARE_PROVIDER_SITE_OTHER): Payer: Medicare Other

## 2019-06-13 ENCOUNTER — Other Ambulatory Visit: Payer: Self-pay

## 2019-06-13 DIAGNOSIS — I739 Peripheral vascular disease, unspecified: Secondary | ICD-10-CM

## 2019-06-14 ENCOUNTER — Telehealth: Payer: Self-pay

## 2019-06-14 NOTE — Telephone Encounter (Signed)
The patient has been notified of the result and verbalized understanding.  All questions (if any) were answered. Wilma Flavin, RN 06/14/2019 10:56 AM

## 2019-06-18 ENCOUNTER — Other Ambulatory Visit: Payer: Self-pay

## 2019-06-18 DIAGNOSIS — I739 Peripheral vascular disease, unspecified: Secondary | ICD-10-CM

## 2019-06-18 NOTE — Progress Notes (Signed)
Please order with expected date

## 2019-06-20 ENCOUNTER — Encounter: Payer: Self-pay | Admitting: Cardiovascular Disease

## 2019-06-20 ENCOUNTER — Ambulatory Visit (INDEPENDENT_AMBULATORY_CARE_PROVIDER_SITE_OTHER): Payer: Medicare Other | Admitting: Cardiovascular Disease

## 2019-06-20 ENCOUNTER — Other Ambulatory Visit: Payer: Self-pay

## 2019-06-20 VITALS — BP 140/72 | HR 77 | Temp 97.3°F | Ht 68.5 in | Wt 161.2 lb

## 2019-06-20 DIAGNOSIS — E785 Hyperlipidemia, unspecified: Secondary | ICD-10-CM

## 2019-06-20 DIAGNOSIS — I739 Peripheral vascular disease, unspecified: Secondary | ICD-10-CM | POA: Diagnosis not present

## 2019-06-20 DIAGNOSIS — I10 Essential (primary) hypertension: Secondary | ICD-10-CM

## 2019-06-20 DIAGNOSIS — R0989 Other specified symptoms and signs involving the circulatory and respiratory systems: Secondary | ICD-10-CM | POA: Diagnosis not present

## 2019-06-20 DIAGNOSIS — I2510 Atherosclerotic heart disease of native coronary artery without angina pectoris: Secondary | ICD-10-CM | POA: Diagnosis not present

## 2019-06-20 DIAGNOSIS — Z720 Tobacco use: Secondary | ICD-10-CM | POA: Diagnosis not present

## 2019-06-20 NOTE — Progress Notes (Signed)
Cardiology Office Note   Date:  06/20/2019   ID:  Sean [name], Sean [name] 08/16/1941, MRN [id number]  PCP:  [name], MD  Cardiologist:   [name], MD   Chief Complaint  Patient presents with  . other    9 month follow up. Meds reviewed by the pt. verbally. "doing well."       History of Present Illness: Sean [name] is a 78 y.o. male who presents for a followup visit regarding peripheral arterial disease and slightly abnormal stress test..  He has prolonged history of tobacco , hypertension and hyperlipidemia.  He is status post right external iliac artery stent placement in May 2014 for severe claudication. He is known to have long occlusion of the left SFA and severe diffuse disease of the right SFA currently being  managed medically. Nuclear stress test in 2014 showed evidence of a small inferior infarct without significant ischemia. Ejection fraction was 52%.  He has been doing well and denies any chest pain or shortness of breath. He denies leg claudication. He continues to smoke close to one pack per day. He had recent lower extremity Doppler studies which showed stable ABI in the 0.6 range with patent right external iliac artery stent and borderline stenosis of the left external iliac artery with velocities 279.  Past Medical History:  Diagnosis Date  . Arthritis   . Cervical disc disease   . COPD (chronic obstructive pulmonary disease) ([place])   . ED (erectile dysfunction)   . Hyperlipidemia   . PAD (peripheral artery disease) ([place]) 02/05/2013   05/14: Subtotally occluded right external iliac artery, long occlusion of the left proximal to mid SFA with 1 vessel runoff below the knee, diffusely diseased right SFA with short occlusion in the midsegment with three-vessel runoff below the knee. Status post self-expanding stent placement to the right external iliac artery.  . Peptic ulcer   . Substance abuse [facility])     Past Surgical History:  Procedure  Laterality Date  . ABDOMINAL AORTAGRAM N/A 02/13/2013   Procedure: ABDOMINAL Maxcine Ham;  Surgeon: Wellington Hampshire, MD;  Location: [place] CATH LAB;  Service: Cardiovascular;  Laterality: N/A;  . CERVICAL [place]  . HARVEST BONE MARROW     Donor  . ILIAC ARTERY STENT     right stent placement  . LOWER EXTREMITY ANGIOGRAM  02/13/2013   Procedure: LOWER EXTREMITY ANGIOGRAM;  Surgeon: Wellington Hampshire, MD;  Location: [place] CATH LAB;  Service: Cardiovascular;;  . PERCUTANEOUS STENT INTERVENTION Right 02/13/2013   Procedure: PERCUTANEOUS STENT INTERVENTION;  Surgeon: Wellington Hampshire, MD;  Location: [place] CATH LAB;  Service: Cardiovascular;  Laterality: Right;  rt ext iliac stent  . STOMACH SURGERY  1974   gastric ulcer     Current Outpatient Medications  Medication Sig Dispense Refill  . amLODipine (NORVASC) 5 MG tablet Take 1 tablet (5 mg total) by mouth daily. 90 tablet 3  . clopidogrel (PLAVIX) 75 MG tablet Take 1 tablet (75 mg total) by mouth daily. 90 tablet 3  . hydrochlorothiazide (HYDRODIURIL) 25 MG tablet Take 1 tablet (25 mg total) by mouth daily. 90 tablet 3  . rosuvastatin (CRESTOR) 40 MG tablet Take 1 tablet (40 mg total) by mouth daily. 90 tablet 3   No current facility-administered medications for this visit.     Allergies:   Patient has no known allergies.    Social History:  The patient  reports that he has been smoking cigarettes.  He has a 13.50 pack-year smoking history. He has never used smokeless tobacco. He reports current drug use. Frequency: 7.00 times per week. Drug: Marijuana. He reports that he does not drink alcohol.   Family History:  The patient's family history includes Arthritis in his sister; Clotting disorder in his mother; Heart disease in his father; Leukemia in his son.    ROS:  Please see the history of present illness.   Otherwise, review of systems are positive for none.   All other systems are reviewed and negative.    PHYSICAL EXAM: VS:  BP  140/72 (BP Location: Left Arm, Patient Position: Sitting, Cuff Size: Normal)   Pulse 77   Temp (!) 97.3 F (36.3 C)   Ht 5' 8.5" (1.74 m)   Wt 161 lb 4 oz (73.1 kg)   BMI 24.16 kg/m  , BMI Body mass index is 24.16 kg/m. GEN: Well nourished, well developed, in no acute distress  HEENT: normal  Neck: no JVD or masses.  Faint right carotid bruit Cardiac: RRR; no murmurs, rubs, or gallops,no edema  Respiratory:  clear to auscultation bilaterally, normal work of breathing GI: soft, nontender, nondistended, + BS MS: no deformity or atrophy  Skin: warm and dry, no rash Neuro:  Strength and sensation are intact Psych: euthymic mood, full affect   EKG:  EKG is ordered today. The ekg ordered today demonstrates sinus rhythm with PACs, low voltage, left anterior fascicular block and possible old inferior infarct.   Recent Labs: 08/01/2018: Hemoglobin 12.3; Platelets 282 08/10/2018: BUN 20; Creatinine, Ser 1.41; Potassium 4.2; Sodium 138    Lipid Panel    Component Value Date/Time   CHOL 129 10/12/2017 1544   CHOL 156 06/23/2014 1129   TRIG 166.0 (H) 10/12/2017 1544   TRIG 134 06/23/2014 1129   HDL 37.00 (L) 10/12/2017 1544   HDL 37 (L) 06/23/2014 1129   CHOLHDL 3 10/12/2017 1544   VLDL 33.2 10/12/2017 1544   VLDL 27 06/23/2014 1129   LDLCALC 59 10/12/2017 1544   LDLCALC 92 06/23/2014 1129   LDLDIRECT 181.8 01/16/2013 1027      Wt Readings from Last 3 Encounters:  06/20/19 161 lb 4 oz (73.1 kg)  10/18/18 168 lb (76.2 kg)  08/01/18 165 lb 8 oz (75.1 kg)       ASSESSMENT AND PLAN:  1.  Peripheral arterial disease: Status post stenting of the right external iliac artery with known bilateral SFA disease which is being managed medically.  Continue medical therapy.  Continue long-term clopidogrel over aspirin as tolerated given better long-term outcome.  2. Presumed coronary artery disease based on abnormal stress test in 2014 which showed prior small inferior infarct.  Currently with no anginal symptoms. Continue medical therapy.  3. Hyperlipidemia: Lipid profile improved significantly with switching him to high-dose rosuvastatin with most recent LDL of 59.  He has a physical scheduled with Dr. Silvio Pate early next year.  4. Tobacco use: He reports inability to quit smoking.  5. Essential hypertension: Blood pressure is reasonably controlled.  6.  Right carotid bruit: I requested carotid Doppler.   Disposition:   FU with me in 1 year  Signed,  [name], MD  06/20/2019 10:10 AM    [place]

## 2019-06-20 NOTE — Patient Instructions (Signed)
Medication Instructions:  Your physician recommends that you continue on your current medications as directed. Please refer to the Current Medication list given to you today.  If you need a refill on your cardiac medications before your next appointment, please call your pharmacy.   Lab work: None ordered If you have labs (blood work) drawn today and your tests are completely normal, you will receive your results only by: Marland Kitchen MyChart Message (if you have MyChart) OR . A paper copy in the mail If you have any lab test that is abnormal or we need to change your treatment, we will call you to review the results.  Testing/Procedures: Your physician has requested that you have a carotid duplex. This test is an ultrasound of the carotid arteries in your neck. It looks at blood flow through these arteries that supply the brain with blood. Allow one hour for this exam. There are no restrictions or special instructions.    Follow-Up: At [facility], you and your health needs are our priority.  As part of our continuing mission to provide you with exceptional heart care, we have created designated Provider Care Teams.  These Care Teams include your primary Cardiologist (physician) and Advanced Practice Providers (APPs -  Physician Assistants and Nurse Practitioners) who all work together to provide you with the care you need, when you need it. You will need a follow up appointment in 12 months.  Please call our office 2 months in advance to schedule this appointment.  You may see  Dr. Fletcher Anon or one of the following Advanced Practice Providers on your designated Care Team:   Murray Hodgkins, NP Christell Faith, PA-C . Marrianne Mood, PA-C  Any Other Special Instructions Will Be Listed Below (If Applicable). N/A

## 2019-07-24 ENCOUNTER — Other Ambulatory Visit: Payer: Self-pay

## 2019-07-24 ENCOUNTER — Ambulatory Visit (INDEPENDENT_AMBULATORY_CARE_PROVIDER_SITE_OTHER): Payer: Medicare Other

## 2019-07-24 DIAGNOSIS — R0989 Other specified symptoms and signs involving the circulatory and respiratory systems: Secondary | ICD-10-CM

## 2019-07-30 ENCOUNTER — Other Ambulatory Visit: Payer: Self-pay | Admitting: Physician Assistant

## 2019-07-30 ENCOUNTER — Telehealth: Payer: Self-pay

## 2019-07-30 NOTE — Telephone Encounter (Signed)
DPR on file. lmom with carotid dopp results. carotid doppler was normal.  Patient is to contact the office if any question or concerns.

## 2019-10-22 ENCOUNTER — Encounter: Payer: Self-pay | Admitting: Internal Medicine

## 2019-10-22 ENCOUNTER — Ambulatory Visit (INDEPENDENT_AMBULATORY_CARE_PROVIDER_SITE_OTHER): Payer: Medicare Other | Admitting: Internal Medicine

## 2019-10-22 ENCOUNTER — Other Ambulatory Visit: Payer: Self-pay

## 2019-10-22 VITALS — BP 138/70 | HR 76 | Temp 97.8°F | Ht 67.0 in | Wt 166.0 lb

## 2019-10-22 DIAGNOSIS — I739 Peripheral vascular disease, unspecified: Secondary | ICD-10-CM | POA: Diagnosis not present

## 2019-10-22 DIAGNOSIS — J449 Chronic obstructive pulmonary disease, unspecified: Secondary | ICD-10-CM | POA: Diagnosis not present

## 2019-10-22 DIAGNOSIS — N183 Chronic kidney disease, stage 3 unspecified: Secondary | ICD-10-CM

## 2019-10-22 DIAGNOSIS — Z7189 Other specified counseling: Secondary | ICD-10-CM | POA: Diagnosis not present

## 2019-10-22 DIAGNOSIS — I2510 Atherosclerotic heart disease of native coronary artery without angina pectoris: Secondary | ICD-10-CM

## 2019-10-22 DIAGNOSIS — Z0000 Encounter for general adult medical examination without abnormal findings: Secondary | ICD-10-CM

## 2019-10-22 DIAGNOSIS — I10 Essential (primary) hypertension: Secondary | ICD-10-CM | POA: Diagnosis not present

## 2019-10-22 LAB — COMPREHENSIVE METABOLIC PANEL
ALT: 18 U/L (ref 0–53)
AST: 18 U/L (ref 0–37)
Albumin: 4.3 g/dL (ref 3.5–5.2)
Alkaline Phosphatase: 86 U/L (ref 39–117)
BUN: 18 mg/dL (ref 6–23)
CO2: 27 mEq/L (ref 19–32)
Calcium: 9.5 mg/dL (ref 8.4–10.5)
Chloride: 105 mEq/L (ref 96–112)
Creatinine, Ser: 1.55 mg/dL — ABNORMAL HIGH (ref 0.40–1.50)
GFR: 52.83 mL/min — ABNORMAL LOW (ref >60.00)
Glucose, Bld: 101 mg/dL — ABNORMAL HIGH (ref 70–99)
Potassium: 4.3 mEq/L (ref 3.5–5.1)
Sodium: 137 mEq/L (ref 135–145)
Total Bilirubin: 0.4 mg/dL (ref 0.2–1.2)
Total Protein: 7.4 g/dL (ref 6.0–8.3)

## 2019-10-22 LAB — CBC
HCT: 40.2 % (ref 39.0–52.0)
Hemoglobin: 12.7 g/dL — ABNORMAL LOW (ref 13.0–17.0)
MCHC: 31.7 g/dL (ref 30.0–36.0)
MCV: 82.5 fl (ref 78.0–100.0)
Platelets: 313 10*3/uL (ref 150.0–400.0)
RBC: 4.87 Mil/uL (ref 4.22–5.81)
RDW: 16.5 % — ABNORMAL HIGH (ref 11.5–15.5)
WBC: 5.8 10*3/uL (ref 4.0–10.5)

## 2019-10-22 LAB — LIPID PANEL
Cholesterol: 123 mg/dL (ref 0–200)
HDL: 34 mg/dL — ABNORMAL LOW (ref >39.00)
LDL Cholesterol: 69 mg/dL (ref 0–99)
NonHDL: 88.91
Total CHOL/HDL Ratio: 4
Triglycerides: 99 mg/dL (ref 0.0–149.0)
VLDL: 19.8 mg/dL (ref 0.0–40.0)

## 2019-10-22 NOTE — Assessment & Plan Note (Signed)
No angina On statin and plavix

## 2019-10-22 NOTE — Progress Notes (Signed)
Hearing Screening   Method: Audiometry   125Hz  250Hz  500Hz  1000Hz  2000Hz  3000Hz  4000Hz  6000Hz  8000Hz   Right ear:   20 20 20  20     Left ear:   20 20 20   0      Visual Acuity Screening   Right eye Left eye Both eyes  Without correction: 20/50 20/25 20/25   With correction:

## 2019-10-22 NOTE — Assessment & Plan Note (Signed)
I have personally reviewed the Medicare Annual Wellness questionnaire and have noted 1. The patient's medical and social history 2. Their use of alcohol, tobacco or illicit drugs 3. Their current medications and supplements 4. The patient's functional ability including ADL's, fall risks, home safety risks and hearing or visual             impairment. 5. Diet and physical activities 6. Evidence for depression or mood disorders  The patients weight, height, BMI and visual acuity have been recorded in the chart I have made referrals, counseling and provided education to the patient based review of the above and I have provided the pt with a written personalized care plan for preventive services.  I have provided you with a copy of your personalized plan for preventive services. Please take the time to review along with your updated medication list.  Colon due again in June No PSA due to age Still prefers no vaccines--but will consider COVID Td if any sig injury Discussed cigarette cessation as usual---at least cut down

## 2019-10-22 NOTE — Assessment & Plan Note (Signed)
See social history 

## 2019-10-22 NOTE — Assessment & Plan Note (Signed)
Stable ABIs Limited walking---does get some pain if goes too far Now on high dose statin

## 2019-10-22 NOTE — Progress Notes (Signed)
Subjective:    Patient ID: Sean [name], male    DOB: 03-21-1941, 79 y.o.   MRN: [id number]  HPI Here for Medicare wellness visit and follow up of chronic health conditions This visit occurred during the SARS-CoV-2 public health emergency.  Safety protocols were in place, including screening questions prior to the visit, additional usage of staff PPE, and extensive cleaning of exam room while observing appropriate contact time as indicated for disinfecting solutions.   Reviewed form and advanced directives Reviewed other doctors Still smokes--and is just not willing to quit No sig alcohol Walks occasionally--but no more than 100 yards Vision is okay---way overdue for eye exam (urged him to go) Hearing is good No falls No depression or anhedonia Independent with instrumental ADLs Memory is fine  Had stable PAD--- ABI in August Some hip pain with walking---not in feet---with extended walking (still better than before procedure)  Breathing is okay No regular cough Doesn't wheeze that he can tell  No chest pain No palpitations No dizziness or syncope  Known GFR in 50's  Current Outpatient Medications on File Prior to Visit  Medication Sig Dispense Refill  . amLODipine (NORVASC) 5 MG tablet Take 1 tablet (5 mg total) by mouth daily. 90 tablet 3  . clopidogrel (PLAVIX) 75 MG tablet TAKE 1 TABLET BY MOUTH EVERY DAY 90 tablet 3  . hydrochlorothiazide (HYDRODIURIL) 25 MG tablet TAKE 1 TABLET BY MOUTH EVERY DAY 90 tablet 3  . rosuvastatin (CRESTOR) 40 MG tablet Take 1 tablet (40 mg total) by mouth daily. 90 tablet 3   No current facility-administered medications on file prior to visit.    No Known Allergies  Past Medical History:  Diagnosis Date  . Arthritis   . Cervical disc disease   . COPD (chronic obstructive pulmonary disease) ([place])   . ED (erectile dysfunction)   . Hyperlipidemia   . PAD (peripheral artery disease) ([place]) 02/05/2013   05/14: Subtotally occluded  right external iliac artery, long occlusion of the left proximal to mid SFA with 1 vessel runoff below the knee, diffusely diseased right SFA with short occlusion in the midsegment with three-vessel runoff below the knee. Status post self-expanding stent placement to the right external iliac artery.  . Peptic ulcer   . Substance abuse [facility])     Past Surgical History:  Procedure Laterality Date  . ABDOMINAL AORTAGRAM N/A 02/13/2013   Procedure: ABDOMINAL Maxcine Ham;  Surgeon: Wellington Hampshire, MD;  Location: [place] CATH LAB;  Service: Cardiovascular;  Laterality: N/A;  . CERVICAL [place]  . HARVEST BONE MARROW     Donor  . ILIAC ARTERY STENT     right stent placement  . LOWER EXTREMITY ANGIOGRAM  02/13/2013   Procedure: LOWER EXTREMITY ANGIOGRAM;  Surgeon: Wellington Hampshire, MD;  Location: [place] CATH LAB;  Service: Cardiovascular;;  . PERCUTANEOUS STENT INTERVENTION Right 02/13/2013   Procedure: PERCUTANEOUS STENT INTERVENTION;  Surgeon: Wellington Hampshire, MD;  Location: [place] CATH LAB;  Service: Cardiovascular;  Laterality: Right;  rt ext iliac stent  . STOMACH SURGERY  1974   gastric ulcer    Family History  Problem Relation Age of Onset  . Clotting disorder Mother        blood clot  . Heart disease Father   . Arthritis Sister   . Leukemia Son        bone marrow transplant  . Hypertension Neg Hx   . Colon cancer Neg Hx   . Stomach cancer  Neg Hx     Social History   Socioeconomic History  . Marital status: Married    Spouse name: Not on file  . Number of children: 7  . Years of education: Not on file  . Highest education level: Not on file  Occupational History  . Occupation: retired Games developer   Tobacco Use  . Smoking status: Current Every Day Smoker    Packs/day: 0.25    Years: 54.00    Pack years: 13.50    Types: Cigarettes  . Smokeless tobacco: Never Used  Substance and Sexual Activity  . Alcohol use: No    Comment: rarely  . Drug use: Yes    Frequency: 7.0 times  per week    Types: Marijuana  . Sexual activity: Not on file  Other Topics Concern  . Not on file  Social History Narrative   No living will   Requests that wife make medical decisions for him if he is unable.   Alternate would be son Beverely Low   Would accept CPR.    Would accept mechanical ventilation.    Would accept feeding tube   Social Determinants of Health   Financial Resource Strain:   . Difficulty of Paying Living Expenses: Not on file  Food Insecurity:   . Worried About Charity fundraiser in the Last Year: Not on file  . Ran Out of Food in the Last Year: Not on file  Transportation Needs:   . Lack of Transportation (Medical): Not on file  . Lack of Transportation (Non-Medical): Not on file  Physical Activity:   . Days of Exercise per Week: Not on file  . Minutes of Exercise per Session: Not on file  Stress:   . Feeling of Stress : Not on file  Social Connections:   . Frequency of Communication with Friends and Family: Not on file  . Frequency of Social Gatherings with Friends and Family: Not on file  . Attends Religious Services: Not on file  . Active Member of Clubs or Organizations: Not on file  . Attends Archivist Meetings: Not on file  . Marital Status: Not on file  Intimate Partner Violence:   . Fear of Current or Ex-Partner: Not on file  . Emotionally Abused: Not on file  . Physically Abused: Not on file  . Sexually Abused: Not on file   Review of Systems Appetite is good Weight fairly stable Sleeps fine---- naps after eating lunch Wears seat belt Full dentures--no problems No suspicious skin lesions No heartburn or dysphagia Bowels move fine---no blood Voids fine. Stream is pretty good. Stable nocturia x 1 No sig back or joint pains    Objective:   Physical Exam  Constitutional: He is oriented to person, place, and time. He appears well-developed. No distress.  HENT:  No oral lesions  Neck: No thyromegaly present.  Cardiovascular:  Normal rate, regular rhythm and normal heart sounds. Exam reveals no gallop.  No murmur heard. No pedal pulses  Respiratory: Effort normal. No respiratory distress. He has no wheezes. He has no rales.  Decreased breath sounds but clear  GI: Soft. There is no abdominal tenderness.  Musculoskeletal:     Comments: Trace edema in feet  Lymphadenopathy:    He has no cervical adenopathy.  Neurological: He is alert and oriented to person, place, and time.  President--- "Edmon Crape, Daisy Floro, Barack Obama" [phone] [id number] Recall 3/3  Skin: No erythema.  Psychiatric: He has a normal mood and affect.  Assessment & Plan:

## 2019-10-22 NOTE — Assessment & Plan Note (Signed)
Known CKD 3

## 2019-10-22 NOTE — Assessment & Plan Note (Signed)
No major symptoms Seems more limited by his vascular disease

## 2019-10-22 NOTE — Assessment & Plan Note (Signed)
BP Readings from Last 3 Encounters:  10/22/19 138/70  06/20/19 140/72  10/18/18 134/70   Reasonable control

## 2019-11-06 ENCOUNTER — Other Ambulatory Visit: Payer: Self-pay | Admitting: Internal Medicine

## 2019-11-21 ENCOUNTER — Other Ambulatory Visit: Payer: Self-pay | Admitting: Internal Medicine

## 2020-08-19 ENCOUNTER — Other Ambulatory Visit: Payer: Self-pay | Admitting: Cardiovascular Disease

## 2020-11-04 ENCOUNTER — Other Ambulatory Visit: Payer: Self-pay | Admitting: Internal Medicine

## 2020-11-21 ENCOUNTER — Telehealth: Payer: Self-pay | Admitting: Internal Medicine

## 2020-11-23 NOTE — Telephone Encounter (Signed)
I left a detailed message for patient to call back and schedule AWV in the next 3 months and I let him know his Crestor rx was approved for 90 days.

## 2020-11-23 NOTE — Telephone Encounter (Signed)
I sent in a 90 day supply of Crestor for his cholesterol. He needs a Medicare Wellness in the next 3 months. Last was 10/2019. Please schedule. Thanks!

## 2021-01-29 ENCOUNTER — Ambulatory Visit (INDEPENDENT_AMBULATORY_CARE_PROVIDER_SITE_OTHER): Payer: Medicare Other | Admitting: Internal Medicine

## 2021-01-29 ENCOUNTER — Encounter: Payer: Self-pay | Admitting: Internal Medicine

## 2021-01-29 ENCOUNTER — Other Ambulatory Visit: Payer: Self-pay

## 2021-01-29 VITALS — BP 124/78 | HR 70 | Temp 97.2°F | Ht 67.0 in | Wt 162.0 lb

## 2021-01-29 DIAGNOSIS — J449 Chronic obstructive pulmonary disease, unspecified: Secondary | ICD-10-CM

## 2021-01-29 DIAGNOSIS — I10 Essential (primary) hypertension: Secondary | ICD-10-CM

## 2021-01-29 DIAGNOSIS — N1831 Chronic kidney disease, stage 3a: Secondary | ICD-10-CM

## 2021-01-29 DIAGNOSIS — Z0000 Encounter for general adult medical examination without abnormal findings: Secondary | ICD-10-CM

## 2021-01-29 DIAGNOSIS — Z7189 Other specified counseling: Secondary | ICD-10-CM | POA: Diagnosis not present

## 2021-01-29 DIAGNOSIS — I739 Peripheral vascular disease, unspecified: Secondary | ICD-10-CM

## 2021-01-29 DIAGNOSIS — I2510 Atherosclerotic heart disease of native coronary artery without angina pectoris: Secondary | ICD-10-CM | POA: Diagnosis not present

## 2021-01-29 LAB — LIPID PANEL
Cholesterol: 128 mg/dL (ref 0–200)
HDL: 32.5 mg/dL — ABNORMAL LOW (ref >39.00)
LDL Cholesterol: 72 mg/dL (ref 0–99)
NonHDL: 95.89
Total CHOL/HDL Ratio: 4
Triglycerides: 121 mg/dL (ref 0.0–149.0)
VLDL: 24.2 mg/dL (ref 0.0–40.0)

## 2021-01-29 LAB — RENAL FUNCTION PANEL
Albumin: 4.3 g/dL (ref 3.5–5.2)
BUN: 23 mg/dL (ref 6–23)
CO2: 26 mEq/L (ref 19–32)
Calcium: 9.9 mg/dL (ref 8.4–10.5)
Chloride: 104 mEq/L (ref 96–112)
Creatinine, Ser: 1.89 mg/dL — ABNORMAL HIGH (ref 0.40–1.50)
GFR: 33.57 mL/min — ABNORMAL LOW (ref >60.00)
Glucose, Bld: 92 mg/dL (ref 70–99)
Phosphorus: 2.9 mg/dL (ref 2.3–4.6)
Potassium: 4 mEq/L (ref 3.5–5.1)
Sodium: 139 mEq/L (ref 135–145)

## 2021-01-29 LAB — CBC
HCT: 39.4 % (ref 39.0–52.0)
Hemoglobin: 12.7 g/dL — ABNORMAL LOW (ref 13.0–17.0)
MCHC: 32.3 g/dL (ref 30.0–36.0)
MCV: 81.5 fl (ref 78.0–100.0)
Platelets: 293 10*3/uL (ref 150.0–400.0)
RBC: 4.83 Mil/uL (ref 4.22–5.81)
RDW: 16.3 % — ABNORMAL HIGH (ref 11.5–15.5)
WBC: 6.1 10*3/uL (ref 4.0–10.5)

## 2021-01-29 LAB — HEPATIC FUNCTION PANEL
ALT: 10 U/L (ref 0–53)
AST: 13 U/L (ref 0–37)
Albumin: 4.3 g/dL (ref 3.5–5.2)
Alkaline Phosphatase: 75 U/L (ref 39–117)
Bilirubin, Direct: 0.1 mg/dL (ref 0.0–0.3)
Total Bilirubin: 0.4 mg/dL (ref 0.2–1.2)
Total Protein: 7.2 g/dL (ref 6.0–8.3)

## 2021-01-29 NOTE — Assessment & Plan Note (Signed)
No angina but very limited exercise tolerance On plavix and statin

## 2021-01-29 NOTE — Progress Notes (Signed)
Hearing Screening   Method: Audiometry   125Hz  250Hz  500Hz  1000Hz  2000Hz  3000Hz  4000Hz  6000Hz  8000Hz   Right ear:   20 20 20   0    Left ear:   20 20 20   0      Visual Acuity Screening   Right eye Left eye Both eyes  Without correction: 20/40 20/30 20/30   With correction:

## 2021-01-29 NOTE — Assessment & Plan Note (Signed)
See social history 

## 2021-01-29 NOTE — Assessment & Plan Note (Signed)
I have personally reviewed the Medicare Annual Wellness questionnaire and have noted 1. The patient's medical and social history 2. Their use of alcohol, tobacco or illicit drugs 3. Their current medications and supplements 4. The patient's functional ability including ADL's, fall risks, home safety risks and hearing or visual             impairment. 5. Diet and physical activities 6. Evidence for depression or mood disorders  The patients weight, height, BMI and visual acuity have been recorded in the chart I have made referrals, counseling and provided education to the patient based review of the above and I have provided the pt with a written personalized care plan for preventive services.  I have provided you with a copy of your personalized plan for preventive services. Please take the time to review along with your updated medication list.  Due for last colonoscopy but he wants to hold off due to COVID No PSA due to age Unable to exercise Prefers no vaccines--urged him to reconsider for COVID Td if injury

## 2021-01-29 NOTE — Progress Notes (Signed)
Subjective:    Patient ID: Sean [name], male    DOB: [birth year], 80 y.o.   MRN: [id number]  HPI Here for Medicare wellness visit and follow up of chronic health conditions This visit occurred during the SARS-CoV-2 public health emergency.  Safety protocols were in place, including screening questions prior to the visit, additional usage of staff PPE, and extensive cleaning of exam room while observing appropriate contact time as indicated for disinfecting solutions.   Reviewed advanced directives Reviewed other doctors---Dr Arida---cardiology, no others No hospitalizations or surgery in the past year Still smoking--not willing to stop (under 1 PPD now) No alcohol-or extremely rare Vision is okay He notes no problems with hearing No exercise Did fall once --[place]. No sig injury.  No depression or anhedonia Had yard man. Does housework though No sig memory issues  Has been mostly staying home due to [place] limited in exercise---walking up hill from [place] does give him the same hip pain Hasn't had vascular evaluation this year  No chest pain No palpitations No dizziness or syncope No edema  No regular cough Wife notes DOE---he feels "I'm just getting old" No wheezing  Last GFR 52  Current Outpatient Medications on File Prior to Visit  Medication Sig Dispense Refill  . amLODipine (NORVASC) 5 MG tablet Take 1 tablet (5 mg total) by mouth daily. MUST SCHEDULE OFFICE VISIT 90 tablet 0  . clopidogrel (PLAVIX) 75 MG tablet TAKE 1 TABLET BY MOUTH EVERY DAY 90 tablet 3  . hydrochlorothiazide (HYDRODIURIL) 25 MG tablet TAKE 1 TABLET BY MOUTH EVERY DAY 90 tablet 3  . rosuvastatin (CRESTOR) 40 MG tablet TAKE 1 TABLET BY MOUTH EVERY DAY 90 tablet 0   No current facility-administered medications on file prior to visit.    Not on File  Past Medical History:  Diagnosis Date  . Arthritis   . Cervical disc disease   . COPD (chronic  obstructive pulmonary disease) ([place])   . ED (erectile dysfunction)   . Hyperlipidemia   . PAD (peripheral artery disease) ([place]) 02/05/2013   05/14: Subtotally occluded right external iliac artery, long occlusion of the left proximal to mid SFA with 1 vessel runoff below the knee, diffusely diseased right SFA with short occlusion in the midsegment with three-vessel runoff below the knee. Status post self-expanding stent placement to the right external iliac artery.  . Peptic ulcer   . Substance abuse [facility])     Past Surgical History:  Procedure Laterality Date  . ABDOMINAL AORTAGRAM N/A 02/13/2013   Procedure: ABDOMINAL Maxcine Ham;  Surgeon: Wellington Hampshire, MD;  Location: [place] CATH LAB;  Service: Cardiovascular;  Laterality: N/A;  . CERVICAL [place]  . HARVEST BONE MARROW     Donor  . ILIAC ARTERY STENT     right stent placement  . LOWER EXTREMITY ANGIOGRAM  02/13/2013   Procedure: LOWER EXTREMITY ANGIOGRAM;  Surgeon: Wellington Hampshire, MD;  Location: [place] CATH LAB;  Service: Cardiovascular;;  . PERCUTANEOUS STENT INTERVENTION Right 02/13/2013   Procedure: PERCUTANEOUS STENT INTERVENTION;  Surgeon: Wellington Hampshire, MD;  Location: [place] CATH LAB;  Service: Cardiovascular;  Laterality: Right;  rt ext iliac stent  . STOMACH SURGERY  1974   gastric ulcer    Family History  Problem Relation Age of Onset  . Clotting disorder Mother        blood clot  . Heart disease Father   . Arthritis Sister   . Leukemia  Son        bone marrow transplant  . Hypertension Neg Hx   . Colon cancer Neg Hx   . Stomach cancer Neg Hx     Social History   Socioeconomic History  . Marital status: Married    Spouse name: Not on file  . Number of children: 7  . Years of education: Not on file  . Highest education level: Not on file  Occupational History  . Occupation: retired Games developer   Tobacco Use  . Smoking status: Current Every Day Smoker    Packs/day: 0.25    Years: 54.00    Pack years: 13.50     Types: Cigarettes  . Smokeless tobacco: Never Used  Substance and Sexual Activity  . Alcohol use: No    Comment: rarely  . Drug use: Yes    Frequency: 7.0 times per week    Types: Marijuana  . Sexual activity: Not on file  Other Topics Concern  . Not on file  Social History Narrative   No living will   Requests that wife make medical decisions for him if he is unable.   Alternate would be son Beverely Low   Would accept CPR.    Would accept mechanical ventilation.    Would accept feeding tube   Social Determinants of Health   Financial Resource Strain: Not on file  Food Insecurity: Not on file  Transportation Needs: Not on file  Physical Activity: Not on file  Stress: Not on file  Social Connections: Not on file  Intimate Partner Violence: Not on file   Review of Systems Full dentures--no dentist Appetite is fine Weight is fairly stable--down a few pounds from last year Sleeps well Wears seat belt No heartburn or dysphagia Voids fine. Stream is good. Nocturia x 1 Bowels move regular. No blood No sig back or joint pains No suspicious skin lesions    Objective:   Physical Exam Constitutional:      Appearance: Normal appearance.  HENT:     Mouth/Throat:     Comments: No lesions Eyes:     Conjunctiva/sclera: Conjunctivae normal.     Pupils: Pupils are equal, round, and reactive to light.  Cardiovascular:     Rate and Rhythm: Normal rate and regular rhythm.     Heart sounds: No murmur heard. No gallop.      Comments: Feet warm but no palpable pulses Pulmonary:     Effort: Pulmonary effort is normal.     Breath sounds: No wheezing or rales.     Comments: Decreased breath sounds but clear Abdominal:     Palpations: Abdomen is soft.     Tenderness: There is no abdominal tenderness.  Musculoskeletal:     Cervical back: Neck supple.     Right lower leg: No edema.     Left lower leg: No edema.  Lymphadenopathy:     Cervical: No cervical adenopathy.  Skin:     General: Skin is warm.     Findings: No rash.     Comments: Small cyst right forearm  Neurological:     Mental Status: He is alert and oriented to person, place, and time.     Comments: President--"Joe Biden, I don't care to call his name, Obama (did say Trump)" [id number] Won't spell backwards Recall 3/3  Psychiatric:        Mood and Affect: Mood normal.        Behavior: Behavior normal.  Assessment & Plan:

## 2021-01-29 NOTE — Assessment & Plan Note (Addendum)
Limited by this and PAD No Rx for now Urged him to at least cut down on smoking

## 2021-01-29 NOTE — Assessment & Plan Note (Signed)
Mild and stable Consider ARB if progresses

## 2021-01-29 NOTE — Assessment & Plan Note (Signed)
Still with exertional hip pain--better since procedure On statin and plavix

## 2021-01-29 NOTE — Assessment & Plan Note (Signed)
BP Readings from Last 3 Encounters:  01/29/21 124/78  10/22/19 138/70  06/20/19 140/72   Good control on HCTZ and amlodipine

## 2021-01-31 ENCOUNTER — Other Ambulatory Visit: Payer: Self-pay | Admitting: Internal Medicine

## 2021-02-03 ENCOUNTER — Other Ambulatory Visit: Payer: Self-pay | Admitting: Internal Medicine

## 2021-02-03 DIAGNOSIS — N1832 Chronic kidney disease, stage 3b: Secondary | ICD-10-CM

## 2021-02-21 ENCOUNTER — Other Ambulatory Visit: Payer: Self-pay | Admitting: Internal Medicine

## 2021-03-25 ENCOUNTER — Other Ambulatory Visit (HOSPITAL_COMMUNITY): Payer: Self-pay | Admitting: Nephrology

## 2021-03-25 ENCOUNTER — Other Ambulatory Visit: Payer: Self-pay | Admitting: Nephrology

## 2021-03-25 DIAGNOSIS — N1832 Chronic kidney disease, stage 3b: Secondary | ICD-10-CM | POA: Diagnosis not present

## 2021-03-25 DIAGNOSIS — R3129 Other microscopic hematuria: Secondary | ICD-10-CM | POA: Diagnosis not present

## 2021-03-25 DIAGNOSIS — I10 Essential (primary) hypertension: Secondary | ICD-10-CM | POA: Diagnosis not present

## 2021-03-25 DIAGNOSIS — R808 Other proteinuria: Secondary | ICD-10-CM | POA: Diagnosis not present

## 2021-03-25 DIAGNOSIS — N2581 Secondary hyperparathyroidism of renal origin: Secondary | ICD-10-CM | POA: Diagnosis not present

## 2021-03-25 DIAGNOSIS — E785 Hyperlipidemia, unspecified: Secondary | ICD-10-CM | POA: Diagnosis not present

## 2021-03-25 DIAGNOSIS — J449 Chronic obstructive pulmonary disease, unspecified: Secondary | ICD-10-CM | POA: Diagnosis not present

## 2021-04-15 ENCOUNTER — Other Ambulatory Visit: Payer: Self-pay

## 2021-04-15 ENCOUNTER — Ambulatory Visit
Admission: RE | Admit: 2021-04-15 | Discharge: 2021-04-15 | Disposition: A | Discharge status: Home / Self Care | Payer: Medicare Other | Source: Ambulatory Visit | Attending: Nephrology | Admitting: Nephrology

## 2021-04-15 DIAGNOSIS — N1832 Chronic kidney disease, stage 3b: Secondary | ICD-10-CM | POA: Diagnosis not present

## 2021-04-15 DIAGNOSIS — N281 Cyst of kidney, acquired: Secondary | ICD-10-CM | POA: Diagnosis not present

## 2021-04-15 DIAGNOSIS — N189 Chronic kidney disease, unspecified: Secondary | ICD-10-CM | POA: Diagnosis not present

## 2021-05-11 DIAGNOSIS — R3129 Other microscopic hematuria: Secondary | ICD-10-CM | POA: Diagnosis not present

## 2021-05-11 DIAGNOSIS — R808 Other proteinuria: Secondary | ICD-10-CM | POA: Diagnosis not present

## 2021-05-11 DIAGNOSIS — J449 Chronic obstructive pulmonary disease, unspecified: Secondary | ICD-10-CM | POA: Diagnosis not present

## 2021-05-11 DIAGNOSIS — E785 Hyperlipidemia, unspecified: Secondary | ICD-10-CM | POA: Diagnosis not present

## 2021-05-11 DIAGNOSIS — N1832 Chronic kidney disease, stage 3b: Secondary | ICD-10-CM | POA: Diagnosis not present

## 2021-05-11 DIAGNOSIS — I10 Essential (primary) hypertension: Secondary | ICD-10-CM | POA: Diagnosis not present

## 2021-05-11 DIAGNOSIS — N2581 Secondary hyperparathyroidism of renal origin: Secondary | ICD-10-CM | POA: Diagnosis not present

## 2021-05-31 DIAGNOSIS — I10 Essential (primary) hypertension: Secondary | ICD-10-CM | POA: Diagnosis not present

## 2021-05-31 DIAGNOSIS — R808 Other proteinuria: Secondary | ICD-10-CM | POA: Diagnosis not present

## 2021-05-31 DIAGNOSIS — R82998 Other abnormal findings in urine: Secondary | ICD-10-CM | POA: Diagnosis not present

## 2021-05-31 DIAGNOSIS — N1832 Chronic kidney disease, stage 3b: Secondary | ICD-10-CM | POA: Diagnosis not present

## 2021-06-03 DIAGNOSIS — N1832 Chronic kidney disease, stage 3b: Secondary | ICD-10-CM | POA: Diagnosis not present

## 2021-06-03 DIAGNOSIS — N2581 Secondary hyperparathyroidism of renal origin: Secondary | ICD-10-CM | POA: Diagnosis not present

## 2021-06-03 DIAGNOSIS — N281 Cyst of kidney, acquired: Secondary | ICD-10-CM | POA: Diagnosis not present

## 2021-06-03 DIAGNOSIS — E785 Hyperlipidemia, unspecified: Secondary | ICD-10-CM | POA: Diagnosis not present

## 2021-06-03 DIAGNOSIS — I10 Essential (primary) hypertension: Secondary | ICD-10-CM | POA: Diagnosis not present

## 2021-06-04 ENCOUNTER — Telehealth: Payer: Self-pay | Admitting: Internal Medicine

## 2021-06-04 NOTE — Telephone Encounter (Signed)
I have called Patient, He states that the Kidney doctor wanted him to cut dose in half. Denies any side effects from medication.

## 2021-06-04 NOTE — Telephone Encounter (Signed)
Pt called stating that he would like his medication rosuvastatin (CRESTOR) 40 MG tablet, reduce to 20mg .

## 2021-06-07 MED ORDER — ROSUVASTATIN CALCIUM 20 MG PO TABS: 20.0000 mg | ORAL_TABLET | Freq: Every day | ORAL | 3 refills | Status: DC

## 2021-06-07 NOTE — Telephone Encounter (Signed)
Spoke to pt. He is taking the 40 mg every other day. He will continue that until it runs out. We have sent in 20mg  to start once the 40 runs out.

## 2021-06-07 NOTE — Addendum Note (Signed)
Addended by: Pilar Grammes on: 06/07/2021 02:01 PM   Modules accepted: Orders

## 2021-08-26 ENCOUNTER — Telehealth: Payer: Self-pay | Admitting: Internal Medicine

## 2021-08-26 NOTE — Chronic Care Management (AMB) (Signed)
°  Chronic Care Management   Note  08/26/2021 Name: Sean [name] MRN: [id number] DOB: 02/03/1941  Sean [name] is a 80 y.o. year old male who is a primary care patient of [name], MD. I reached out to Tracie Harrier by phone today in response to a referral sent by [name]'s PCP, [name], MD.   [name] was given information about Chronic Care Management services today including:  CCM service includes personalized support from designated clinical staff supervised by his physician, including individualized plan of care and coordination with other care providers 24/7 contact phone numbers for assistance for urgent and routine care needs. Service will only be billed when office clinical staff spend 20 minutes or more in a month to coordinate care. Only one practitioner may furnish and bill the service in a calendar month. The patient may stop CCM services at any time (effective at the end of the month) by phone call to the office staff.   Patient agreed to services and verbal consent obtained.   Follow up plan:   Tatjana Secretary/administrator

## 2021-09-03 ENCOUNTER — Other Ambulatory Visit: Payer: Self-pay | Admitting: Cardiovascular Disease

## 2021-09-03 NOTE — Telephone Encounter (Signed)
Attempted to schedule no answer

## 2021-09-03 NOTE — Telephone Encounter (Signed)
Please schedule overdue F/U appointment for refills. Thank you! 

## 2021-09-21 ENCOUNTER — Other Ambulatory Visit: Payer: Self-pay

## 2021-09-21 ENCOUNTER — Encounter: Payer: Self-pay | Admitting: Cardiovascular Disease

## 2021-09-21 ENCOUNTER — Ambulatory Visit (INDEPENDENT_AMBULATORY_CARE_PROVIDER_SITE_OTHER): Payer: Medicare Other | Admitting: Cardiovascular Disease

## 2021-09-21 VITALS — BP 150/60 | HR 72 | Ht 67.0 in | Wt 162.5 lb

## 2021-09-21 DIAGNOSIS — I739 Peripheral vascular disease, unspecified: Secondary | ICD-10-CM | POA: Diagnosis not present

## 2021-09-21 DIAGNOSIS — I10 Essential (primary) hypertension: Secondary | ICD-10-CM

## 2021-09-21 DIAGNOSIS — E785 Hyperlipidemia, unspecified: Secondary | ICD-10-CM

## 2021-09-21 DIAGNOSIS — Z720 Tobacco use: Secondary | ICD-10-CM

## 2021-09-21 MED ORDER — CLOPIDOGREL BISULFATE 75 MG PO TABS: 75.0000 mg | ORAL_TABLET | Freq: Every day | ORAL | 2 refills | Status: DC

## 2021-09-21 MED ORDER — HYDROCHLOROTHIAZIDE 25 MG PO TABS: 25.0000 mg | ORAL_TABLET | Freq: Every day | ORAL | 2 refills | Status: DC

## 2021-09-21 MED ORDER — ROSUVASTATIN CALCIUM 20 MG PO TABS: 20.0000 mg | ORAL_TABLET | Freq: Every day | ORAL | 3 refills | Status: DC

## 2021-09-21 NOTE — Progress Notes (Signed)
Cardiology Office Note   Date:  09/21/2021   ID:  Sean [name], Sean [name] [birth year], MRN [id number]  PCP:  [name], MD  Cardiologist:   [name], MD   Chief Complaint  Patient presents with   Other    OD 12 Month f/u no complaints today. Meds reviewed verbally with pt.      History of Present Illness: Sean [name] is a 81 y.o. male who presents for a followup visit regarding peripheral arterial disease and slightly abnormal stress test..  He has prolonged history of tobacco , hypertension, chronic kidney disease and hyperlipidemia.  He is status post right external iliac artery stent placement in May 2014 for severe claudication. He is known to have long bilateral SFA occlusions  being  managed medically. Nuclear stress test in 2014 showed evidence of a small inferior infarct without significant ischemia. Ejection fraction was 52%.   I have not seen him since 2020.  He reports no chest pain or significant dyspnea.  No significant leg claudication.  He continues to smoke.  He takes his medications regularly.  He now follows with nephrology for chronic kidney disease.  Past Medical History:  Diagnosis Date   Arthritis    Cervical disc disease    COPD (chronic obstructive pulmonary disease) ([place])    ED (erectile dysfunction)    Hyperlipidemia    PAD (peripheral artery disease) ([place]) 02/05/2013   05/14: Subtotally occluded right external iliac artery, long occlusion of the left proximal to mid SFA with 1 vessel runoff below the knee, diffusely diseased right SFA with short occlusion in the midsegment with three-vessel runoff below the knee. Status post self-expanding stent placement to the right external iliac artery.   Peptic ulcer    Substance abuse [facility])     Past Surgical History:  Procedure Laterality Date   ABDOMINAL AORTAGRAM N/A 02/13/2013   Procedure: ABDOMINAL Maxcine Ham;  Surgeon: Wellington Hampshire, MD;  Location: [place] CATH LAB;  Service: Cardiovascular;   Laterality: N/A;   CERVICAL DISC SURGERY  1996   HARVEST BONE MARROW     Donor   ILIAC ARTERY STENT     right stent placement   LOWER EXTREMITY ANGIOGRAM  02/13/2013   Procedure: LOWER EXTREMITY ANGIOGRAM;  Surgeon: Wellington Hampshire, MD;  Location: [place] CATH LAB;  Service: Cardiovascular;;   PERCUTANEOUS STENT INTERVENTION Right 02/13/2013   Procedure: PERCUTANEOUS STENT INTERVENTION;  Surgeon: Wellington Hampshire, MD;  Location: [place] CATH LAB;  Service: Cardiovascular;  Laterality: Right;  rt ext iliac stent   STOMACH SURGERY  1974   gastric ulcer     Current Outpatient Medications  Medication Sig Dispense Refill   amLODipine (NORVASC) 5 MG tablet Take 1 tablet (5 mg total) by mouth daily. 90 tablet 3   clopidogrel (PLAVIX) 75 MG tablet Take 1 tablet (75 mg total) by mouth daily. PLEASE CALL OFFICE TO SCHEDULE AN APPOINTMENT. 15 tablet 0   hydrochlorothiazide (HYDRODIURIL) 25 MG tablet Take 1 tablet (25 mg total) by mouth daily. PLEASE CALL OFFICE TO SCHEDULE AN APPOINTMENT. 15 tablet 0   rosuvastatin (CRESTOR) 20 MG tablet Take 1 tablet (20 mg total) by mouth daily. 90 tablet 3   No current facility-administered medications for this visit.    Allergies:   Patient has no allergy information on record.    Social History:  The patient  reports that he has been smoking cigarettes. He has a 13.50 pack-year smoking history. He has never used smokeless  tobacco. He reports current drug use. Frequency: 7.00 times per week. Drug: Marijuana. He reports that he does not drink alcohol.   Family History:  The patient's family history includes Arthritis in his sister; Clotting disorder in his mother; Heart disease in his father; Leukemia in his son.    ROS:  Please see the history of present illness.   Otherwise, review of systems are positive for none.   All other systems are reviewed and negative.    PHYSICAL EXAM: VS:  BP (!) 150/60 (BP Location: Left Arm, Patient Position: Sitting, Cuff Size:  Normal)    Pulse 72    Ht 5\' 7"  (1.702 m)    Wt 162 lb 8 oz (73.7 kg)    SpO2 98%    BMI 25.45 kg/m  , BMI Body mass index is 25.45 kg/m. GEN: Well nourished, well developed, in no acute distress  HEENT: normal  Neck: no JVD or masses.  Faint right carotid bruit Cardiac: RRR; no murmurs, rubs, or gallops,no edema  Respiratory:  clear to auscultation bilaterally, normal work of breathing GI: soft, nontender, nondistended, + BS MS: no deformity or atrophy  Skin: warm and dry, no rash Neuro:  Strength and sensation are intact Psych: euthymic mood, full affect   EKG:  EKG is ordered today. The ekg ordered today demonstrates normal sinus rhythm with PACs and left anterior fascicular block.   Recent Labs: 01/29/2021: ALT 10; BUN 23; Creatinine, Ser 1.89; Hemoglobin 12.7; Platelets 293.0; Potassium 4.0; Sodium 139    Lipid Panel    Component Value Date/Time   CHOL 128 01/29/2021 1223   CHOL 156 06/23/2014 1129   TRIG 121.0 01/29/2021 1223   TRIG 134 06/23/2014 1129   HDL 32.50 (L) 01/29/2021 1223   HDL 37 (L) 06/23/2014 1129   CHOLHDL 4 01/29/2021 1223   VLDL 24.2 01/29/2021 1223   VLDL 27 06/23/2014 1129   LDLCALC 72 01/29/2021 1223   LDLCALC 92 06/23/2014 1129   LDLDIRECT 181.8 01/16/2013 1027      Wt Readings from Last 3 Encounters:  09/21/21 162 lb 8 oz (73.7 kg)  01/29/21 162 lb (73.5 kg)  10/22/19 166 lb (75.3 kg)       ASSESSMENT AND PLAN:  1.  Peripheral arterial disease: Status post stenting of the right external iliac artery with known bilateral SFA occlusion which is being managed medically.  Currently with no significant leg claudication.  He is overdue for a follow-up ABI and aortoiliac duplex which was requested today. Continue long-term treatment with clopidogrel without aspirin.  2. Presumed coronary artery disease based on abnormal stress test in 2014 which showed prior small inferior infarct. Currently with no anginal symptoms. Continue medical  therapy.  3. Hyperlipidemia: I reviewed most recent lipid profile done in May which showed an LDL of 72 and triglyceride of 121.  Continue treatment with rosuvastatin.  4. Tobacco use: He reports inability to quit smoking.  5. Essential hypertension: Blood pressure is mildly elevated today but usually is more controlled.  This is being managed by nephrology.  6.  Right carotid bruit: Previous carotid Doppler showed no significant disease.  Disposition:   FU with me in 1 year  Signed,  [name], MD  09/21/2021 4:40 PM    [facility]

## 2021-09-21 NOTE — Patient Instructions (Signed)
Medication Instructions:  Your physician recommends that you continue on your current medications as directed. Please refer to the Current Medication list given to you today.  *If you need a refill on your cardiac medications before your next appointment, please call your pharmacy*   Lab Work: None ordered If you have labs (blood work) drawn today and your tests are completely normal, you will receive your results only by: [place] (if you have MyChart) OR A paper copy in the mail If you have any lab test that is abnormal or we need to change your treatment, we will call you to review the results.   Testing/Procedures: Your physician has requested that you have a aorta/ivc/iliac diplex  Your physician has requested that you have an ankle brachial index (ABI). During this test an ultrasound and blood pressure cuff are used to evaluate the arteries that supply the arms and legs with blood. Allow thirty minutes for this exam. There are no restrictions or special instructions.   Follow-Up: At [facility], you and your health needs are our priority.  As part of our continuing mission to provide you with exceptional heart care, we have created designated Provider Care Teams.  These Care Teams include your primary Cardiologist (physician) and Advanced Practice Providers (APPs -  Physician Assistants and Nurse Practitioners) who all work together to provide you with the care you need, when you need it.  We recommend signing up for the patient portal called "MyChart".  Sign up information is provided on this After Visit Summary.  MyChart is used to connect with patients for Virtual Visits (Telemedicine).  Patients are able to view lab/test results, encounter notes, upcoming appointments, etc.  Non-urgent messages can be sent to your provider as well.   To learn more about what you can do with MyChart, go to [email or web address].    Your next appointment:   Your physician wants you to  follow-up in: 1 year You will receive a reminder letter in the mail two months in advance. If you don't receive a letter, please call our office to schedule the follow-up appointment.   The format for your next appointment:   In Person  Provider:   You may see [name], MD or one of the following Advanced Practice Providers on your designated Care Team:   Murray Hodgkins, NP Christell Faith, PA-C Cadence Kathlen Mody, PA-C{    Other Instructions N/A

## 2021-10-07 ENCOUNTER — Other Ambulatory Visit: Payer: Self-pay | Admitting: Cardiovascular Disease

## 2021-10-07 DIAGNOSIS — E785 Hyperlipidemia, unspecified: Secondary | ICD-10-CM | POA: Diagnosis not present

## 2021-10-07 DIAGNOSIS — R808 Other proteinuria: Secondary | ICD-10-CM | POA: Diagnosis not present

## 2021-10-07 DIAGNOSIS — I739 Peripheral vascular disease, unspecified: Secondary | ICD-10-CM

## 2021-10-07 DIAGNOSIS — I10 Essential (primary) hypertension: Secondary | ICD-10-CM | POA: Diagnosis not present

## 2021-10-07 DIAGNOSIS — N281 Cyst of kidney, acquired: Secondary | ICD-10-CM | POA: Diagnosis not present

## 2021-10-07 DIAGNOSIS — N1832 Chronic kidney disease, stage 3b: Secondary | ICD-10-CM | POA: Diagnosis not present

## 2021-10-07 DIAGNOSIS — N2581 Secondary hyperparathyroidism of renal origin: Secondary | ICD-10-CM | POA: Diagnosis not present

## 2021-10-13 ENCOUNTER — Ambulatory Visit (INDEPENDENT_AMBULATORY_CARE_PROVIDER_SITE_OTHER): Payer: Medicare Other

## 2021-10-13 ENCOUNTER — Encounter: Payer: Self-pay | Admitting: Pharmacist

## 2021-10-13 ENCOUNTER — Other Ambulatory Visit: Payer: Self-pay

## 2021-10-13 ENCOUNTER — Ambulatory Visit (INDEPENDENT_AMBULATORY_CARE_PROVIDER_SITE_OTHER): Payer: Medicare Other | Admitting: Pharmacist

## 2021-10-13 ENCOUNTER — Telehealth: Payer: Self-pay

## 2021-10-13 DIAGNOSIS — J449 Chronic obstructive pulmonary disease, unspecified: Secondary | ICD-10-CM

## 2021-10-13 DIAGNOSIS — I2510 Atherosclerotic heart disease of native coronary artery without angina pectoris: Secondary | ICD-10-CM

## 2021-10-13 DIAGNOSIS — I739 Peripheral vascular disease, unspecified: Secondary | ICD-10-CM

## 2021-10-13 DIAGNOSIS — I10 Essential (primary) hypertension: Secondary | ICD-10-CM

## 2021-10-13 DIAGNOSIS — Z95820 Peripheral vascular angioplasty status with implants and grafts: Secondary | ICD-10-CM | POA: Diagnosis not present

## 2021-10-13 DIAGNOSIS — Z720 Tobacco use: Secondary | ICD-10-CM

## 2021-10-13 DIAGNOSIS — N1832 Chronic kidney disease, stage 3b: Secondary | ICD-10-CM

## 2021-10-13 NOTE — Patient Instructions (Signed)
Visit Information  Phone number for Pharmacist: [phone]  Thank you for meeting with me to discuss your medications! I look forward to working with you to achieve your health care goals. Below is a summary of what we talked about during the visit:   Goals Addressed             This Visit's Progress    Track and Manage My Blood Pressure-Hypertension       Timeframe:  Long-Range Goal Priority:  High Start Date:        10/13/21                   Expected End Date:  10/13/22                     Follow Up Date June 2024   - check blood pressure daily - choose a place to take my blood pressure (home, clinic or office, retail store) - write blood pressure results in a log or diary    Why is this important?   You won't feel high blood pressure, but it can still hurt your blood vessels.  High blood pressure can cause heart or kidney problems. It can also cause a stroke.  Making lifestyle changes like losing a little weight or eating less salt will help.  Checking your blood pressure at home and at different times of the day can help to control blood pressure.  If the doctor prescribes medicine remember to take it the way the doctor ordered.  Call the office if you cannot afford the medicine or if there are questions about it.     Notes:         Care Plan : Sean [name]  Updates made by Sean [name], RPH since 10/13/2021 12:00 AM     Problem: Hypertension, Hyperlipidemia, Coronary Artery Disease, COPD, Chronic Kidney Disease, and Tobacco use   Priority: High     Long-Range Goal: Disease mgmt   Start Date: 10/13/2021  Expected End Date: 10/13/2022  This Visit's Progress: On track  Priority: High  Note:   Current Barriers:  Unable to independently monitor therapeutic efficacy  Pharmacist Clinical Goal(s):  Patient will achieve adherence to monitoring guidelines and medication adherence to achieve therapeutic efficacy through collaboration with PharmD and  provider.   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) -Not ideally controlled - BP at recent office visit was 146/78; pt is not checking BP at home, he does own a home monitor; he reports drinking 3 cups of coffee daily and smoking ~17 cigarettes; discussed impact of caffeine/nicotine on high BP; discussed possible white coat syndrome and importance of monitoring BP at home -Current home readings: n/a -Current treatment: Amlodipine 5 mg daily AM HCTZ 25 mg daily AM  -Medications previously tried: n/a  -Current dietary habits: recently made diet changes per nephrology recommendations - avoids cakes, cookies, sodas, fast food -Current exercise habits: walking around the house -Denies hypotensive/hypertensive symptoms -Educated on BP goals and benefits of medications for prevention of heart attack, stroke and kidney damage; Daily salt intake goal < 2300 mg; Importance of home blood pressure monitoring; Proper BP monitoring technique; -Counseled to monitor BP at home daily,  document, and provide log at future appointments -Recommended to continue current medication  Hyperlipidemia: (LDL goal < 70) -Not ideally controlled - LDL was 72 on Crestor 40 mg, has not been re-checked since reducing to 20 mg  -Hx PAD s/p iliac stenting; Hx CAD (abnormal stress test 2014) -Current treatment: Rosuvastatin 20 mg daily HS Clopidogrel 75 mg daily AM -Medications previously tried: atorvastatin  -Educated on Cholesterol goals; Benefits of statin for ASCVD risk reduction; -Recommended to continue current medication; repeat lipid panel with next physical  COPD  (Goal: control symptoms and prevent exacerbations) -Controlled without inhalers currently -Pulmonary function testing: not on file -Gold Grade: unknown -Current COPD Classification:  A (low sx, <2 exacerbations/yr) -MMRC/CAT score: not on file -Exacerbations requiring treatment in last 6 months: 0  Tobacco use (Goal: cessation) -Not ideally controlled- pt smokes 17 cigarettes per day.  -Previous quit attempts: none -pt reports MOTIVATION to quit is low -Discussed impact of nicotine on BP; discussed benefits of quitting; pt is not ready to quit  Health Maintenance -Vaccine gaps: Prevnar, TD, Shingrix - pt prefers no vaccines -Current therapy:  +Vitamin D  -Patient is satisfied with current therapy and denies issues -Recommended to continue current medication  Patient Goals/Self-Care Activities Patient will:  - take medications as prescribed as evidenced by patient report and record review focus on medication adherence by routine check blood pressure daily, document, and provide at future appointments      Sean [name] was given information about Chronic Care Management services today including:  CCM service includes personalized support from designated clinical staff supervised by his physician, including individualized plan of care and coordination with other care providers 24/7 contact phone numbers for assistance for urgent and routine care needs. Standard insurance, coinsurance, copays and deductibles apply for chronic care management only during months in which we provide at least 20 minutes of these services. Most insurances cover these services at 100%, however patients may be responsible for any copay, coinsurance and/or deductible if applicable. This service may help you avoid the need for more expensive face-to-face services. Only one practitioner may furnish and bill the service in a calendar month. The patient may stop CCM services at any time (effective at the end of the  month) by phone call to the office staff.  Patient agreed to services and verbal consent obtained.   The patient verbalized understanding of instructions, educational materials, and care plan provided today and declined offer to receive copy of patient instructions, educational materials, and care plan.  Telephone follow up appointment with pharmacy team member scheduled for: 4 months  Sean [name], PharmD, [facility] Clinical Pharmacist [place] [facility] [phone]

## 2021-10-13 NOTE — Progress Notes (Signed)
Chronic Care Management Pharmacy Note  10/13/2021 Name:  Sean [name] MRN:  [id number] DOB:  [birth year]  Summary: -Initial CCM visit: pt endorses compliance with medication as prescribed -Pt has had elevated BP at recent office visits (146/78 last week at nephrology appt), he is not checking BP at home. He is concerned about kidney function. -LDL was 72 on rosuvastatin 40 mg, lipid panel has not been done since reducing dose to 20 mg; LDL goal < 70 given CAD/PAD with iliac stent  Recommendations/Changes made from today's visit: -No med changes -Advised to monitor BP at home for a few weeks to establish average/baseline -Recommend repeat lipid panel at next physical; consider ezetimibe if needed  Plan: -[facility] will call patient 3 weeks for BP log -Pharmacist follow up televisit scheduled for 4 months -PCP physical 02/01/22   Subjective: Sean [name] is an 81 y.o. year old male who is a primary patient of [name], MD.  The CCM team was consulted for assistance with disease management and care coordination needs.    Engaged with patient by telephone for initial visit in response to provider referral for pharmacy case management and/or care coordination services.   Consent to Services:  The patient was given the following information about Chronic Care Management services today, agreed to services, and gave verbal consent: 1. CCM service includes personalized support from designated clinical staff supervised by the primary care provider, including individualized plan of care and coordination with other care providers 2. 24/7 contact phone numbers for assistance for urgent and routine care needs. 3. Service will only be billed when office clinical staff spend 20 minutes or more in a month to coordinate care. 4. Only one practitioner may furnish and bill the service in a calendar month. 5.The patient may stop CCM services at any time (effective at the end of the  month) by phone call to the office staff. 6. The patient will be responsible for cost sharing (co-pay) of up to 20% of the service fee (after annual deductible is met). Patient agreed to services and consent obtained.  Patient Care Team: [name], MD as PCP - General Deeana Atwater, Cleaster Corin, [facility] as Pharmacist (Pharmacist)  Patient lives at home with his wife.  Recent office visits: 01/29/21 Dr Silvio Pate OV: annual visit. Urged cut down on smoking. Due for colonoscopy. Prefers no vaccines. Unable to exercise. GFR declines 50s > 33, referred to nephrology.  Recent consult visits: 10/13/2021 - Cardiology - ABI 10/07/2021 - Lyla Son, MD - Nephrology - Patient presented for acquired multiple cysts of kidney. PTH elevated, recheck next visit. 09/21/2021 - [name], MD - Cardiology - Patient presented for peripheral artery disease. Orders: VAS [place] ABI WITH/WO TBI, VAS US AORTA/IVC/ILIACS and EKG. No medication changes.  06/03/2021 - Lyla Son, MD - Nephrology - Patient presented for hyperlipidemia follow up. Reduce rosuvastatin 40 mg > 20 mg daily. 04/15/2021 - Radiology - US Renal 03/25/21 Dr Lanora Manis (Nephrology): new consult. CKD 3b d/t HTN, NSAIDs. Advised to stop Aleve.  Hospital visits: None in previous 6 months   Objective:  Lab Results  Component Value Date   CREATININE 1.89 (H) 01/29/2021   BUN 23 01/29/2021   GFR 33.57 (L) 01/29/2021   GFRNONAA 48 (L) 08/10/2018   GFRAA 56 (L) 08/10/2018   NA 139 01/29/2021   K 4.0 01/29/2021   CALCIUM 9.9 01/29/2021   CO2 26 01/29/2021   GLUCOSE 92 01/29/2021    Lab Results  Component Value Date/Time   GFR 33.57 (L) 01/29/2021 12:23 PM   GFR 52.83 (L) 10/22/2019 10:15 AM    Last diabetic Eye exam: No results found for: HMDIABEYEEXA  Last diabetic Foot exam: No results found for: HMDIABFOOTEX   Lab Results  Component Value Date   CHOL 128 01/29/2021   HDL 32.50 (L) 01/29/2021   LDLCALC 72 01/29/2021    LDLDIRECT 181.8 01/16/2013   TRIG 121.0 01/29/2021   CHOLHDL 4 01/29/2021    Hepatic Function Latest Ref Rng & Units 01/29/2021 01/29/2021 10/22/2019  Total Protein 6.0 - 8.3 g/dL - 7.2 7.4  Albumin 3.5 - 5.2 g/dL 4.3 4.3 4.3  AST 0 - 37 U/L - 13 18  ALT 0 - 53 U/L - 10 18  Alk Phosphatase 39 - 117 U/L - 75 86  Total Bilirubin 0.2 - 1.2 mg/dL - 0.4 0.4  Bilirubin, Direct 0.0 - 0.3 mg/dL - 0.1 -    Lab Results  Component Value Date/Time   TSH 1.09 01/16/2013 10:27 AM    CBC Latest Ref Rng & Units 01/29/2021 10/22/2019 08/01/2018  WBC 4.0 - 10.5 K/uL 6.1 5.8 6.1  Hemoglobin 13.0 - 17.0 g/dL 12.7(L) 12.7(L) 12.3(L)  Hematocrit 39.0 - 52.0 % 39.4 40.2 38.2  Platelets 150.0 - 400.0 K/uL 293.0 313.0 282    No results found for: VD25OH  Clinical ASCVD: Yes  The ASCVD Risk score (Arnett DK, et al., 2019) failed to calculate for the following reasons:   The valid total cholesterol range is 130 to 320 mg/dL   Unable to determine if patient is Non-Hispanic African American    Depression screen [facility] 2/9 01/29/2021 10/18/2018 10/12/2017  Decreased Interest 0 0 0  Down, Depressed, Hopeless 0 0 0  PHQ - 2 Score 0 0 0     Social History   Tobacco Use  Smoking Status Every Day   Packs/day: 0.75   Years: 54.00   Pack years: 40.50   Types: Cigarettes   Start date: 09/12/1966  Smokeless Tobacco Never  Tobacco Comments   Smokes ~17 cigarettes per day   BP Readings from Last 3 Encounters:  09/21/21 (!) 150/60  01/29/21 124/78  10/22/19 138/70   Pulse Readings from Last 3 Encounters:  09/21/21 72  01/29/21 70  10/22/19 76   Wt Readings from Last 3 Encounters:  09/21/21 162 lb 8 oz (73.7 kg)  01/29/21 162 lb (73.5 kg)  10/22/19 166 lb (75.3 kg)   BMI Readings from Last 3 Encounters:  09/21/21 25.45 kg/m  01/29/21 25.37 kg/m  10/22/19 26.00 kg/m    Assessment/Interventions: Review of patient past medical history, allergies, medications, health status, including review of  consultants reports, laboratory and other test data, was performed as part of comprehensive evaluation and provision of chronic care management services.   SDOH:  (Social Determinants of Health) assessments and interventions performed: Yes SDOH Interventions    Flowsheet Row Most Recent Value  SDOH Interventions   Financial Strain Interventions Intervention Not Indicated  Transportation Interventions Intervention Not Indicated      SDOH Screenings   Alcohol Screen: Not on file  Depression (PHQ2-9): Low Risk    PHQ-2 Score: 0  Financial Resource Strain: Low Risk    Difficulty of Paying Living Expenses: Not hard at all  Food Insecurity: Not on file  Housing: Not on file  Physical Activity: Not on file  Social Connections: Not on file  Stress: Not on file  Tobacco Use: High Risk   Smoking  Tobacco Use: Every Day   Smokeless Tobacco Use: Never   Passive Exposure: Not on file  Transportation Needs: No Transportation Needs   Lack of Transportation (Medical): No   Lack of Transportation (Non-Medical): No    CCM Care Plan  Not on File  Medications Reviewed Today     Reviewed by Charlton Haws, [facility] (Pharmacist) on 10/13/21 at 48  Med List Status: <None>   Medication Order Taking? Sig Documenting Provider Last Dose Status Informant  amLODipine (NORVASC) 5 MG tablet [id number] Yes Take 1 tablet (5 mg total) by mouth daily. [name], MD Taking Active   clopidogrel (PLAVIX) 75 MG tablet [id number] Yes Take 1 tablet (75 mg total) by mouth daily. Wellington Hampshire, MD Taking Active   hydrochlorothiazide (HYDRODIURIL) 25 MG tablet [id number] Yes Take 1 tablet (25 mg total) by mouth daily. Wellington Hampshire, MD Taking Active   rosuvastatin (CRESTOR) 20 MG tablet [id number] Yes Take 1 tablet (20 mg total) by mouth daily. Wellington Hampshire, MD Taking Active             Patient Active Problem List   Diagnosis Date Noted   Chronic kidney disease, stage 3a ([place])  10/18/2018   Advanced directives, counseling/discussion 08/01/2014   CAD (coronary artery disease), native coronary artery 04/01/2013   PAD (peripheral artery disease) ([place]) 02/05/2013   Essential hypertension 02/05/2013   Tobacco abuse 02/05/2013   Hyperlipidemia    Routine general medical examination at a health care facility 11/28/2011   Osteoarthrosis involving, or with mention of more than one site, but not specified as generalized, multiple sites 11/28/2011   ERECTILE DYSFUNCTION, 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   COPD (chronic obstructive pulmonary disease) ([place]) 04/11/2007   COLONIC POLYPS, HX OF 04/11/2007    Immunization History  Administered Date(s) Administered   Td 04/10/2006    Conditions to be addressed/monitored:  Hypertension, Hyperlipidemia, Coronary Artery Disease, COPD, Chronic Kidney Disease, and Tobacco use  Care Plan : [place]  Updates made by Charlton Haws, RPH since 10/13/2021 12:00 AM     Problem: Hypertension, Hyperlipidemia, Coronary Artery Disease, COPD, Chronic Kidney Disease, and Tobacco use   Priority: High     Long-Range Goal: Disease mgmt   Start Date: 10/13/2021  Expected End Date: 10/13/2022  This Visit's Progress: On track  Priority: High  Note:   Current Barriers:  Unable to independently monitor therapeutic efficacy  Pharmacist Clinical Goal(s):  Patient will achieve adherence to monitoring guidelines and medication adherence to achieve therapeutic efficacy through collaboration with PharmD and provider.   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) -Not ideally controlled - BP at  recent office visit was 146/78; pt is not checking BP at home, he does own a home monitor; he reports drinking 3 cups of coffee daily and smoking ~17 cigarettes; discussed impact of caffeine/nicotine on high BP; discussed possible white coat syndrome and importance of monitoring BP at home -Current home readings: n/a -Current treatment: Amlodipine 5 mg daily AM HCTZ 25 mg daily AM  -Medications previously tried: n/a  -Current dietary habits: recently made diet changes per nephrology recommendations - avoids cakes, cookies, sodas, fast food -Current exercise habits: walking around the house -Denies hypotensive/hypertensive symptoms -  Educated on BP goals and benefits of medications for prevention of heart attack, stroke and kidney damage; Daily salt intake goal < 2300 mg; Importance of home blood pressure monitoring; Proper BP monitoring technique; -Counseled to monitor BP at home daily, document, and provide log at future appointments -Recommended to continue current medication  Hyperlipidemia: (LDL goal < 70) -Not ideally controlled - LDL was 72 on Crestor 40 mg, has not been re-checked since reducing to 20 mg  -Hx PAD s/p iliac stenting; Hx CAD (abnormal stress test 2014) -Current treatment: Rosuvastatin 20 mg daily HS Clopidogrel 75 mg daily AM -Medications previously tried: atorvastatin  -Educated on Cholesterol goals; Benefits of statin for ASCVD risk reduction; -Recommended to continue current medication; repeat lipid panel with next physical  COPD (Goal: control symptoms and prevent exacerbations) -Controlled without inhalers currently -Pulmonary function testing: not on file -Gold Grade: unknown -Current COPD Classification:  A (low sx, <2 exacerbations/yr) -MMRC/CAT score: not on file -Exacerbations requiring treatment in last 6 months: 0  Tobacco use (Goal: cessation) -Not ideally controlled- pt smokes 17 cigarettes per day.  -Previous quit attempts: none -pt reports  MOTIVATION to quit is low -Discussed impact of nicotine on BP; discussed benefits of quitting; pt is not ready to quit  Health Maintenance -Vaccine gaps: Prevnar, TD, Shingrix - pt prefers no vaccines -Current therapy:  +Vitamin D  -Patient is satisfied with current therapy and denies issues -Recommended to continue current medication  Patient Goals/Self-Care Activities Patient will:  - take medications as prescribed as evidenced by patient report and record review focus on medication adherence by routine check blood pressure daily, document, and provide at future appointments      Medication Assistance: None required.  Patient affirms current coverage meets needs.  Compliance/Adherence/Medication fill history: Care Gaps: Colonoscopy (due 03/03/20) - pt deferred 01/2021 due to COVID  Star-Rating Drugs: Rosuvastatin  20 mg - LF 09/04/21 x 90 ds; [place] 100%  Patient's preferred pharmacy is:  [id number] [place] [address] [place]Phone: [phone]: [phone] Uses pill box? No -   - morning meds in plastic bag, statin on table Pt endorses 100% compliance  We discussed: Current pharmacy is preferred with insurance plan and patient is satisfied with pharmacy services Patient decided to: Continue current medication management strategy  Care Plan and Follow Up Patient Decision:  Patient agrees to Care Plan and Follow-up.  Plan: Telephone follow up appointment with care management team member scheduled for:  4 months  LCharlene Brooke PharmD, BCACP Clinical Pharmacist LDumas[phone]

## 2021-10-13 NOTE — Progress Notes (Signed)
° ° °  Chronic Care Management Pharmacy Assistant   Name: Sean [name]  MRN: [id number] DOB: August 25, 1941  Reason for Encounter: CCM (Initial Questions)   Recent office visits:  None since last CCM contact  Recent consult visits:  10/13/2021 - Cardiology - VAS US AORTA/IVC/ILIACS 10/07/2021 - Lyla Son, MD - Nephrology - Patient presented for acquired multiple cysts of kidney.  09/21/2021 - [name], MD - Cardiology - Patient presented for peripheral artery disease. Orders: VAS [place] ABI WITH/WO TBI, VAS US AORTA/IVC/ILIACS and EKG. No medication changes.  06/03/2021 - Lyla Son, MD - Nephrology - Patient presented for hyperlipidemia follow up. Change: Rosuvastatin 20 mg daily vs. 40 mg daily.  04/15/2021 - Radiology - US Renal  Hospital visits:  None in previous 6 months  Medications: Outpatient Encounter Medications as of 10/13/2021  Medication Sig   amLODipine (NORVASC) 5 MG tablet Take 1 tablet (5 mg total) by mouth daily.   clopidogrel (PLAVIX) 75 MG tablet Take 1 tablet (75 mg total) by mouth daily.   hydrochlorothiazide (HYDRODIURIL) 25 MG tablet Take 1 tablet (25 mg total) by mouth daily.   rosuvastatin (CRESTOR) 20 MG tablet Take 1 tablet (20 mg total) by mouth daily.   No facility-administered encounter medications on file as of 10/13/2021.   No results found for: HGBA1C, MICROALBUR   BP Readings from Last 3 Encounters:  09/21/21 (!) 150/60  01/29/21 124/78  10/22/19 138/70   Patient contacted to review initial questions prior to visit with [name].  Have you seen any other providers since your last visit with PCP? Yes - Patient had his vascular ultrasound appointment this morning. Patient states there has been no change since the last one he had two years ago.   Any changes in your medications or health? Yes - Change: Rosuvastatin 20 mg daily vs. 40 mg daily on 06/03/2021.  Any side effects from any medications? No  Do you have an symptoms  or problems not managed by your medications? No  Any concerns about your health right now? No  Has your provider asked that you check blood pressure, blood sugar, or follow special diet at home? No  Do you get any type of exercise on a regular basis? Yes - Patient walks a little; Patient also has an elliptical machine he occasionally uses.   Can you think of a goal you would like to reach for your health? Yes Patient would like to live until he dies.   Do you have any problems getting your medications? No  Is there anything that you would like to discuss during the appointment? No  Spoke with patient and reminded them to have all medications, supplements and any blood glucose and blood pressure readings available for review with pharmacist, at their telephone visit on 10/13/2021 at 2:00.    Star Rating Drugs:  Medication:  Last Fill: Day Supply Rosuvastatin 20 mg 09/04/2021 90   Care Gaps: Annual wellness visit in last year? Yes 01/29/2021 Most Recent BP reading: 150/60 on 09/21/2021  [name], CPP notified  Marijean Niemann, [place] Clinical Pharmacy Assistant [phone]  Time Spent: 40 Minutes

## 2021-10-14 ENCOUNTER — Telehealth: Payer: Self-pay

## 2021-10-14 DIAGNOSIS — I739 Peripheral vascular disease, unspecified: Secondary | ICD-10-CM

## 2021-10-14 NOTE — Telephone Encounter (Signed)
-----   Message from Wellington Hampshire, MD sent at 10/14/2021 11:36 AM EST -----  Stable ABI and patent right iliac stent.  Repeat studies in 1 year.

## 2021-10-14 NOTE — Telephone Encounter (Signed)
Patient made aware of results with verbalized understanding. Order placed for 69yr repeat testing.

## 2021-11-03 ENCOUNTER — Telehealth: Payer: Self-pay

## 2021-11-03 NOTE — Progress Notes (Signed)
° ° °  Chronic Care Management Pharmacy Assistant   Name: Sean [name]  MRN: [id number] DOB: 22-Oct-1940  Reason for Encounter: CCM (Blood Pressure Log)   I called patient for his blood pressure log as was previously discussed with [name].  Date  Blood Pressure Pulse  Time Taken 02/22  144/69   69   9:21 am 02/11  143/65   86  12:00 pm 02/08  140/65   68  8:50 pm 02/07  132/67   82  3:30 pm  [name], CPP notified  Marijean Niemann, [place] Clinical Pharmacy Assistant [phone]  Time Spent: 10 Minutes

## 2021-11-09 DIAGNOSIS — I2510 Atherosclerotic heart disease of native coronary artery without angina pectoris: Secondary | ICD-10-CM

## 2021-11-09 DIAGNOSIS — J449 Chronic obstructive pulmonary disease, unspecified: Secondary | ICD-10-CM | POA: Diagnosis not present

## 2021-11-09 DIAGNOSIS — I10 Essential (primary) hypertension: Secondary | ICD-10-CM | POA: Diagnosis not present

## 2021-12-30 ENCOUNTER — Other Ambulatory Visit: Payer: Self-pay | Admitting: Internal Medicine

## 2022-02-01 ENCOUNTER — Ambulatory Visit (INDEPENDENT_AMBULATORY_CARE_PROVIDER_SITE_OTHER): Payer: Medicare Other | Admitting: Internal Medicine

## 2022-02-01 ENCOUNTER — Encounter: Payer: Self-pay | Admitting: Internal Medicine

## 2022-02-01 VITALS — BP 124/74 | HR 92 | Temp 97.9°F | Ht 67.0 in | Wt 158.0 lb

## 2022-02-01 DIAGNOSIS — N2581 Secondary hyperparathyroidism of renal origin: Secondary | ICD-10-CM | POA: Diagnosis not present

## 2022-02-01 DIAGNOSIS — Z0000 Encounter for general adult medical examination without abnormal findings: Secondary | ICD-10-CM | POA: Diagnosis not present

## 2022-02-01 DIAGNOSIS — I10 Essential (primary) hypertension: Secondary | ICD-10-CM

## 2022-02-01 DIAGNOSIS — N1832 Chronic kidney disease, stage 3b: Secondary | ICD-10-CM | POA: Diagnosis not present

## 2022-02-01 DIAGNOSIS — F17210 Nicotine dependence, cigarettes, uncomplicated: Secondary | ICD-10-CM

## 2022-02-01 DIAGNOSIS — J449 Chronic obstructive pulmonary disease, unspecified: Secondary | ICD-10-CM

## 2022-02-01 DIAGNOSIS — I739 Peripheral vascular disease, unspecified: Secondary | ICD-10-CM

## 2022-02-01 DIAGNOSIS — I2510 Atherosclerotic heart disease of native coronary artery without angina pectoris: Secondary | ICD-10-CM | POA: Diagnosis not present

## 2022-02-01 NOTE — Assessment & Plan Note (Signed)
I have personally reviewed the Medicare Annual Wellness questionnaire and have noted 1. The patient's medical and social history 2. Their use of alcohol, tobacco or illicit drugs 3. Their current medications and supplements 4. The patient's functional ability including ADL's, fall risks, home safety risks and hearing or visual             impairment. 5. Diet and physical activities 6. Evidence for depression or mood disorders  The patients weight, height, BMI and visual acuity have been recorded in the chart I have made referrals, counseling and provided education to the patient based review of the above and I have provided the pt with a written personalized care plan for preventive services.  I have provided you with a copy of your personalized plan for preventive services. Please take the time to review along with your updated medication list.  Discussed exercise Still smokes--won't stop Prefers no vaccines Was due for colon in 2021 due to 6 small polyps---he held off due to [place] and still doesn't want it now

## 2022-02-01 NOTE — Progress Notes (Signed)
Vision Screening   Right eye Left eye Both eyes  Without correction '20/40 20/40 20/30 '$  With correction     Hearing Screening - Comments:: Passed whisper test

## 2022-02-01 NOTE — Progress Notes (Signed)
Subjective:    Patient ID: Sean [name], male    DOB: 01-15-1941, 81 y.o.   MRN: [id number]  HPI Here for Medicare wellness visit and follow up of chronic health conditions Reviewed advanced directives Reviewed other doctors---Dr ARida---cardiology, Dr Genelle Bal No hospitalizations or surgery in past year Vision slightly worse---urged him to get eye exam Hearing is okay No alcohol Still smokes No falls No depression or anhedonia Doesn't really exercise Does housework but not yard work. He and wife do shopping, etc No sig memory problems  Now seeing Dr Lanora Manis for kidneys Last blood work showed GFR slightly up to 40 Now on high dose vitamin D due to high PTH Statin dose reduced to '20mg'$  crestor  Still smoking--not willing to stop (but has cut down) No cough  No SOB---but does very little exercise Has some buttock pain when he walks---has had vascular evaluation  No chest pain No palpitations No dizziness or syncope No edema  Current Outpatient Medications on File Prior to Visit  Medication Sig Dispense Refill   amLODipine (NORVASC) 5 MG tablet TAKE 1 TABLET (5 MG TOTAL) BY MOUTH DAILY. 90 tablet 3   Cholecalciferol (VITAMIN D3) 1.25 MG (50000 UT) CAPS Take 1 capsule by mouth once a week.     clopidogrel (PLAVIX) 75 MG tablet Take 1 tablet (75 mg total) by mouth daily. 90 tablet 2   hydrochlorothiazide (HYDRODIURIL) 25 MG tablet Take 1 tablet (25 mg total) by mouth daily. 90 tablet 2   rosuvastatin (CRESTOR) 20 MG tablet Take 1 tablet (20 mg total) by mouth daily. 90 tablet 3   No current facility-administered medications on file prior to visit.    No Known Allergies  Past Medical History:  Diagnosis Date   Arthritis    Cervical disc disease    COPD (chronic obstructive pulmonary disease) (HCC)    ED (erectile dysfunction)    Hyperlipidemia    PAD (peripheral artery disease) ([place]) 02/05/2013   05/14: Subtotally occluded right external iliac  artery, long occlusion of the left proximal to mid SFA with 1 vessel runoff below the knee, diffusely diseased right SFA with short occlusion in the midsegment with three-vessel runoff below the knee. Status post self-expanding stent placement to the right external iliac artery.   Peptic ulcer    Substance abuse [facility])     Past Surgical History:  Procedure Laterality Date   ABDOMINAL AORTAGRAM N/A 02/13/2013   Procedure: ABDOMINAL Maxcine Ham;  Surgeon: Wellington Hampshire, MD;  Location: [facility] CATH LAB;  Service: Cardiovascular;  Laterality: N/A;   CERVICAL DISC SURGERY  1996   HARVEST BONE MARROW     Donor   ILIAC ARTERY STENT     right stent placement   LOWER EXTREMITY ANGIOGRAM  02/13/2013   Procedure: LOWER EXTREMITY ANGIOGRAM;  Surgeon: Wellington Hampshire, MD;  Location: [facility] [facility] C[facility];  Service: Cardiovascular;;   PERCUTANEOUS STENT INTERVENTION Right 02/13/2013   Procedure: PERCUTANEOUS STENT INTERVENTION;  Surgeon: Wellington Hampshire, MD;  Location: [facility] C[facility];  Service: Cardiovascular;  Laterality: Right;  rt ext iliac stent   STOMACH SURGERY  1974   gastric ulcer    Family History  Problem Relation Age of Onset   Clotting disorder Mother        blood clot   Heart disease Father    Arthritis Sister    Leukemia Son        bone marrow transplant   Hypertension Neg Hx    Colon cancer Neg  Hx    Stomach cancer Neg Hx     Social History   Socioeconomic History   Marital status: Married    Spouse name: Not on file   Number of children: 7   Years of education: Not on file   Highest education level: Not on file  Occupational History   Occupation: retired Games developer   Tobacco Use   Smoking status: Every Day    Packs/day: 0.75    Years: 54.00    Pack years: 40.50    Types: Cigarettes    Start date: 09/12/1966    Passive exposure: Past   Smokeless tobacco: Never   Tobacco comments:    Smokes ~17 cigarettes per day  Substance and Sexual Activity   Alcohol use: No    Comment: rarely    Drug use: Yes    Frequency: 7.0 times per week    Types: Marijuana   Sexual activity: Not on file  Other Topics Concern   Not on file  Social History Narrative   No living will   Requests that wife make medical decisions for him if he is unable.   Alternate would be son Beverely Low   Would accept CPR.    Would accept mechanical ventilation.    Would accept feeding tube   Social Determinants of Health   Financial Resource Strain: Low Risk    Difficulty of Paying Living Expenses: Not hard at all  Food Insecurity: Not on file  Transportation Needs: No Transportation Needs   Lack of Transportation (Medical): No   Lack of Transportation (Non-Medical): No  Physical Activity: Not on file  Stress: Not on file  Social Connections: Not on file  Intimate Partner Violence: Not on file   Review of Systems Appetite is fine Weight is stable Sleeps fine Wears seat belt Occasional knee aching--ben gay helps No heartburn or dysphagia Bowels move fine--slight blood on paper just once Voids okay--flow is okay. Nocturia x 1 No suspicious skin lesions--did notice a lump in left axilla    Objective:   Physical Exam Constitutional:      Appearance: Normal appearance.  HENT:     Mouth/Throat:     Comments: No lesions Eyes:     Conjunctiva/sclera: Conjunctivae normal.     Pupils: Pupils are equal, round, and reactive to light.  Cardiovascular:     Rate and Rhythm: Normal rate and regular rhythm.     Heart sounds: No murmur heard.   No gallop.     Comments: Feet warm---barely palpable pulses Pulmonary:     Effort: Pulmonary effort is normal.     Breath sounds: No wheezing or rales.     Comments: Decreased breath sounds but clear Abdominal:     Palpations: Abdomen is soft.     Tenderness: There is no abdominal tenderness.  Musculoskeletal:     Cervical back: Neck supple.     Right lower leg: No edema.     Left lower leg: No edema.  Lymphadenopathy:     Cervical: No cervical  adenopathy.  Skin:    Findings: No lesion or rash.     Comments: Small cyst in anterior portion of left axilla  Neurological:     General: No focal deficit present.     Mental Status: He is alert and oriented to person, place, and time.     Comments: Mini-cog normal  Psychiatric:        Mood and Affect: Mood normal.        Behavior:  Behavior normal.           Assessment & Plan:

## 2022-02-01 NOTE — Assessment & Plan Note (Signed)
No major symptoms since not doing any exertion Won't stop smoking

## 2022-02-01 NOTE — Assessment & Plan Note (Signed)
GFR slightly better in January Will get labs again with Dr Lanora Manis next month

## 2022-02-01 NOTE — Assessment & Plan Note (Signed)
BP Readings from Last 3 Encounters:  02/01/22 124/74  09/21/21 (!) 150/60  01/29/21 124/78   Good control on amlodipine 5 and HCTZ 25

## 2022-02-01 NOTE — Assessment & Plan Note (Signed)
Discussed at least cutting down on cigs On plavix 75 and crestor 20

## 2022-02-01 NOTE — Assessment & Plan Note (Signed)
Will have PTH rechecked next month on the vitamin D

## 2022-02-01 NOTE — Assessment & Plan Note (Signed)
He is willing to consider lung cancer screening Will make the referral

## 2022-02-09 ENCOUNTER — Telehealth: Payer: Self-pay

## 2022-02-09 NOTE — Progress Notes (Signed)
    Chronic Care Management Pharmacy Assistant   Name: Sean [name]  MRN: [id number] DOB: 1940-09-13  Reason for Encounter: CCM (Appointment Reminder)   Medications: Outpatient Encounter Medications as of 02/09/2022  Medication Sig   amLODipine (NORVASC) 5 MG tablet TAKE 1 TABLET (5 MG TOTAL) BY MOUTH DAILY.   Cholecalciferol (VITAMIN D3) 1.25 MG (50000 UT) CAPS Take 1 capsule by mouth once a week.   clopidogrel (PLAVIX) 75 MG tablet Take 1 tablet (75 mg total) by mouth daily.   hydrochlorothiazide (HYDRODIURIL) 25 MG tablet Take 1 tablet (25 mg total) by mouth daily.   rosuvastatin (CRESTOR) 20 MG tablet Take 1 tablet (20 mg total) by mouth daily.   No facility-administered encounter medications on file as of 02/09/2022.   OCTAVIAN GODEK was contacted to remind of upcoming telephone visit with [name] on 02/11/2022 at 1:45. Patient was reminded to have any blood glucose and blood pressure readings available for review at appointment.   Patient confirmed appointment.  Are you having any problems with your medications? No   Do you have any concerns you like to discuss with the pharmacist? No  CCM referral has been placed prior to visit?  No   Star Rating Drugs: Medication:   Last Fill: Day Supply Rosuvastatin 20 mg  12/03/2021 [place], CPP notified  Marijean Niemann, [place] Pharmacy Assistant [phone]

## 2022-02-10 DIAGNOSIS — I10 Essential (primary) hypertension: Secondary | ICD-10-CM | POA: Diagnosis not present

## 2022-02-10 DIAGNOSIS — N2581 Secondary hyperparathyroidism of renal origin: Secondary | ICD-10-CM | POA: Diagnosis not present

## 2022-02-10 DIAGNOSIS — N281 Cyst of kidney, acquired: Secondary | ICD-10-CM | POA: Diagnosis not present

## 2022-02-10 DIAGNOSIS — E785 Hyperlipidemia, unspecified: Secondary | ICD-10-CM | POA: Diagnosis not present

## 2022-02-10 DIAGNOSIS — N1832 Chronic kidney disease, stage 3b: Secondary | ICD-10-CM | POA: Diagnosis not present

## 2022-02-10 DIAGNOSIS — R808 Other proteinuria: Secondary | ICD-10-CM | POA: Diagnosis not present

## 2022-02-11 ENCOUNTER — Ambulatory Visit: Payer: Medicare Other | Admitting: Pharmacist

## 2022-02-11 DIAGNOSIS — N1832 Chronic kidney disease, stage 3b: Secondary | ICD-10-CM

## 2022-02-11 DIAGNOSIS — I739 Peripheral vascular disease, unspecified: Secondary | ICD-10-CM

## 2022-02-11 DIAGNOSIS — I10 Essential (primary) hypertension: Secondary | ICD-10-CM

## 2022-02-11 DIAGNOSIS — F17210 Nicotine dependence, cigarettes, uncomplicated: Secondary | ICD-10-CM

## 2022-02-11 DIAGNOSIS — I2510 Atherosclerotic heart disease of native coronary artery without angina pectoris: Secondary | ICD-10-CM

## 2022-02-11 DIAGNOSIS — J449 Chronic obstructive pulmonary disease, unspecified: Secondary | ICD-10-CM

## 2022-02-11 NOTE — Progress Notes (Signed)
Chronic Care Management Pharmacy Note  02/11/2022 Name:  Sean [name] MRN:  [id number] DOB:  18-Aug-1941  Summary: CCM F/U visit -Reviewed medications; pt affirmed compliance as prescribed -Chronic conditions are controlled/at goal  Recommendations/Changes made from today's visit: -No med changes  Plan: Transition CCM to Self Care: Patient achieved CCM goals and no longer needs to be contacted as frequently. Patient advised services will still be available to them if they would like to reach out or have any new health concerns. Verified patient had contact information to pharmacist and health concierge on hand. Patient made aware CCM services would be continued if desired. Patient consented to cancel future CCM appointments.   Subjective: Sean [name] is an 81 y.o. year old male who is a primary patient of [place], Theophilus Kinds, MD.  The CCM team was consulted for assistance with disease management and care coordination needs.    Engaged with patient by telephone for follow up visit in response to provider referral for pharmacy case management and/or care coordination services.   Consent to Services:  The patient was given information about Chronic Care Management services, agreed to services, and gave verbal consent prior to initiation of services.  Please see initial visit note for detailed documentation.   Patient Care Team: [name], MD as PCP - General Juma Oxley, Cleaster Corin, [facility] as Pharmacist (Pharmacist)  Patient lives at home with his wife.  Recent office visits: 02/01/22 Dr Silvio Pate OV: annual visit - referred for lung cancer screening. Declined colonoscopy. Not ready to stop smoking.  01/29/21 Dr Silvio Pate OV: annual visit. Urged cut down on smoking. Due for colonoscopy. Prefers no vaccines. Unable to exercise. GFR declines 50s > 33, referred to nephrology.  Recent consult visits: 10/13/2021 - Cardiology - ABI 10/07/2021 - Lyla Son, MD - Nephrology -  Patient presented for acquired multiple cysts of kidney. PTH elevated, recheck next visit. 09/21/2021 - [name], MD - Cardiology - Patient presented for peripheral artery disease. Orders: VAS [place] ABI WITH/WO TBI, VAS US AORTA/IVC/ILIACS and EKG. No medication changes.  06/03/2021 - Lyla Son, MD - Nephrology - Patient presented for hyperlipidemia follow up. Reduce rosuvastatin 40 mg > 20 mg daily. 04/15/2021 - Radiology - US Renal 03/25/21 Dr Lanora Manis (Nephrology): new consult. CKD 3b d/t HTN, NSAIDs. Advised to stop Aleve.  Hospital visits: None in previous 6 months   Objective:  Lab Results  Component Value Date   CREATININE 1.89 (H) 01/29/2021   BUN 23 01/29/2021   GFR 33.57 (L) 01/29/2021   GFRNONAA 48 (L) 08/10/2018   GFRAA 56 (L) 08/10/2018   NA 139 01/29/2021   K 4.0 01/29/2021   CALCIUM 9.9 01/29/2021   CO2 26 01/29/2021   GLUCOSE 92 01/29/2021    Lab Results  Component Value Date/Time   GFR 33.57 (L) 01/29/2021 12:23 PM   GFR 52.83 (L) 10/22/2019 10:15 AM    Last diabetic Eye exam: No results found for: HMDIABEYEEXA  Last diabetic Foot exam: No results found for: HMDIABFOOTEX   Lab Results  Component Value Date   CHOL 128 01/29/2021   HDL 32.50 (L) 01/29/2021   LDLCALC 72 01/29/2021   LDLDIRECT 181.8 01/16/2013   TRIG 121.0 01/29/2021   CHOLHDL 4 01/29/2021       Latest Ref Rng & Units 01/29/2021   12:23 PM 10/22/2019   10:15 AM 10/12/2017    3:44 PM  Hepatic Function  Total Protein 6.0 - 8.3 g/dL 7.2   7.4  7.3    Albumin 3.5 - 5.2 g/dL 4.3     4.3   4.3   4.3    AST 0 - 37 U/L _0 ALT 0 - 53 U/L _1 Alk Phosphatase 39 - 117 U/L 75   86   77    Total Bilirubin 0.2 - 1.2 mg/dL 0.4   0.4   0.4    Bilirubin, Direct 0.0 - 0.3 mg/dL 0.1        Lab Results  Component Value Date/Time   TSH 1.09 01/16/2013 10:27 AM       Latest Ref Rng & Units 01/29/2021   12:23 PM 10/22/2019   10:15 AM 08/01/2018    9:18 AM  CBC   WBC 4.0 - 10.5 K/uL 6.1   5.8   6.1    Hemoglobin 13.0 - 17.0 g/dL 12.7   12.7   12.3    Hematocrit 39.0 - 52.0 % 39.4   40.2   38.2    Platelets 150.0 - 400.0 K/uL 293.0   313.0   282      No results found for: VD25OH  Clinical ASCVD: Yes  The ASCVD Risk score (Arnett DK, et al., 2019) failed to calculate for the following reasons:   Unable to determine if patient is Non-Hispanic African American       02/01/2022    9:35 AM 01/29/2021   12:17 PM 10/18/2018    4:04 PM  Depression screen PHQ 2/9  Decreased Interest 0 0 0  Down, Depressed, Hopeless 0 0 0  PHQ - 2 Score 0 0 0     Social History   Tobacco Use  Smoking Status Every Day   Packs/day: 0.75   Years: 54.00   Pack years: 40.50   Types: Cigarettes   Start date: 09/12/1966   Passive exposure: Past  Smokeless Tobacco Never  Tobacco Comments   Smokes ~17 cigarettes per day   BP Readings from Last 3 Encounters:  02/01/22 124/74  09/21/21 (!) 150/60  01/29/21 124/78   Pulse Readings from Last 3 Encounters:  02/01/22 92  09/21/21 72  01/29/21 70   Wt Readings from Last 3 Encounters:  02/01/22 158 lb (71.7 kg)  09/21/21 162 lb 8 oz (73.7 kg)  01/29/21 162 lb (73.5 kg)   BMI Readings from Last 3 Encounters:  02/01/22 24.75 kg/m  09/21/21 25.45 kg/m  01/29/21 25.37 kg/m    Assessment/Interventions: Review of patient past medical history, allergies, medications, health status, including review of consultants reports, laboratory and other test data, was performed as part of comprehensive evaluation and provision of chronic care management services.   SDOH:  (Social Determinants of Health) assessments and interventions performed: No - done Feb 2023   SDOH Screenings   Alcohol Screen: Not on file  Depression (PHQ2-9): Low Risk    PHQ-2 Score: 0  Financial Resource Strain: Low Risk    Difficulty of Paying Living Expenses: Not hard at all  Food Insecurity: Not on file  Housing: Not on file  Physical  Activity: Not on file  Social Connections: Not on file  Stress: Not on file  Tobacco Use: High Risk   Smoking Tobacco Use: Every Day   Smokeless Tobacco Use: Never   Passive Exposure: Past  Transportation Needs: No Transportation Needs   Lack of Transportation (Medical): No   Lack of Transportation (Non-Medical): No  CCM Care Plan  No Known Allergies  Medications Reviewed Today     Reviewed by Charlton Haws, [facility] (Pharmacist) on 02/11/22 at 1359  Med List Status: <None>   Medication Order Taking? Sig Documenting Provider Last Dose Status Informant  amLODipine (NORVASC) 5 MG tablet [id number] Yes TAKE 1 TABLET (5 MG TOTAL) BY MOUTH DAILY. [name], MD Taking Active   Cholecalciferol (VITAMIN D3) 1.25 MG (50000 UT) CAPS [id number] Yes Take 1 capsule by mouth once a week. [provider] Taking Active   clopidogrel (PLAVIX) 75 MG tablet [id number] Yes Take 1 tablet (75 mg total) by mouth daily. Wellington Hampshire, MD Taking Active   hydrochlorothiazide (HYDRODIURIL) 25 MG tablet [id number] Yes Take 1 tablet (25 mg total) by mouth daily. Wellington Hampshire, MD Taking Active   rosuvastatin (CRESTOR) 20 MG tablet [id number] Yes Take 1 tablet (20 mg total) by mouth daily. Wellington Hampshire, MD Taking Active             Patient Active Problem List   Diagnosis Date Noted   Hyperparathyroidism due to renal insufficiency ([place]) 02/01/2022   Stage 3b chronic kidney disease ([place]) 10/18/2018   Advanced directives, counseling/discussion 08/01/2014   CAD (coronary artery disease), native coronary artery 04/01/2013   PAD (peripheral artery disease) ([place]) 02/05/2013   Essential hypertension 02/05/2013   Cigarette smoker 02/05/2013   Hyperlipidemia    Routine general medical examination at a health care facility 11/28/2011   Osteoarthrosis involving, or with mention of more than one site, but not specified as generalized, multiple sites 11/28/2011   ERECTILE DYSFUNCTION,  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   COPD (chronic obstructive pulmonary disease) ([place]) 04/11/2007   COLONIC POLYPS, HX OF 04/11/2007    Immunization History  Administered Date(s) Administered   Td 04/10/2006    Conditions to be addressed/monitored:  Hypertension, Hyperlipidemia, Coronary Artery Disease, COPD, Chronic Kidney Disease, and Tobacco use  Care Plan : [place]  Updates made by Charlton Haws, [place] since 02/11/2022 12:00 AM     Problem: Hypertension, Hyperlipidemia, Coronary Artery Disease, COPD, Chronic Kidney Disease, and Tobacco use   Priority: High     Long-Range Goal: Disease mgmt Completed 02/11/2022  Start Date: 10/13/2021  Expected End Date: 10/13/2022  This Visit's Progress: On track  Recent Progress: On track  Priority: High  Note:   Current Barriers:  None identified  Pharmacist Clinical Goal(s):  Patient will contact provider office for questions/concerns as evidenced notation of same in electronic health record through collaboration with PharmD and provider.   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) -Controlled - BP at goal in recent OV; pt still smoking and has no desire to quit -Current home readings: n/a -Current treatment: Amlodipine 5 mg daily AM - Appropriate, Effective, Safe, Accessible HCTZ 25 mg daily AM - Appropriate, Effective, Safe, Accessible -Medications previously tried: n/a  -Educated on BP goals and benefits of medications for prevention of heart attack, stroke and kidney damage;  -Counseled to monitor BP at home periodically -Recommended to continue current  medication  Hyperlipidemia: (LDL goal < 70) -Controlled - LDL 79 (02/2022) near goal -Hx PAD s/p iliac stenting; Hx CAD (abnormal stress test 2014) -Current treatment: Rosuvastatin 20  mg daily HS - Appropriate, Effective, Safe, Accessible Clopidogrel 75 mg daily AM - Appropriate, Effective, Safe, Accessible -Medications previously tried: atorvastatin  -Educated on Cholesterol goals; Benefits of statin for ASCVD risk reduction; -Recommended to continue current medication; repeat lipid panel with next physical  COPD (Goal: control symptoms and prevent exacerbations) -Controlled without inhalers currently -Pulmonary function testing: not on file -Gold Grade: unknown -Current COPD Classification:  A (low sx, <2 exacerbations/yr) -MMRC/CAT score: not on file -Exacerbations requiring treatment in last 6 months: 0  Tobacco use (Goal: cessation) -Not ideally controlled- pt smokes 17 cigarettes per day.  -Previous quit attempts: none -pt reports MOTIVATION to quit is low -Discussed impact of nicotine on BP; discussed benefits of quitting; pt is not ready to quit  Chronic Kidney Disease Stage 3b -All medications assessed for renal dosing and appropriateness in chronic kidney disease. -on Vitamin D 50,000 IU weekly for elevated PTH - Appropriate, Effective, Safe, Accessible -Recommended to continue current medication  Health Maintenance -Vaccine gaps: Prevnar, TD, Shingrix - pt prefers no vaccines  Patient Goals/Self-Care Activities Patient will:  - take medications as prescribed as evidenced by patient report and record review focus on medication adherence by routine check blood pressure daily, document, and provide at future appointments       Medication Assistance: None required.  Patient affirms current coverage meets needs.  Compliance/Adherence/Medication fill history: Care Gaps: Colonoscopy (due 03/03/20) - pt deferred 01/2022  Star-Rating Drugs: Rosuvastatin  20 mg - PDC  100%  Patient's preferred pharmacy is:  [id number] [place] [id number]: [phone]: [phone] Uses pill box? No -   - morning meds in plastic bag, statin on table Pt endorses 100% compliance  We discussed: Current pharmacy is preferred with insurance plan and patient is satisfied with pharmacy services Patient decided to: Continue current medication management strategy  Care Plan and Follow Up Patient Decision:  Patient agrees to Care Plan and Follow-up.  Plan: The patient has been provided with contact information for the care management team and has been advised to call with any health related questions or concerns.   LCharlene Brooke PharmD, BCACP Clinical Pharmacist LParrottPrimary Care at [facility]Chap[phone]

## 2022-02-11 NOTE — Patient Instructions (Addendum)
Visit Information  Phone number for Pharmacist: [phone]   Goals Addressed   None     Care Plan : [place]  Updates made by Charlton Haws, RPH since 02/11/2022 12:00 AM     Problem: Hypertension, Hyperlipidemia, Coronary Artery Disease, COPD, Chronic Kidney Disease, and Tobacco use   Priority: High     Long-Range Goal: Disease mgmt Completed 02/11/2022  Start Date: 10/13/2021  Expected End Date: 10/13/2022  This Visit's Progress: On track  Recent Progress: On track  Priority: High  Note:   Current Barriers:  None identified  Pharmacist Clinical Goal(s):  Patient will contact provider office for questions/concerns as evidenced notation of same in electronic health record through collaboration with PharmD and provider.   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) -Controlled - BP at goal in recent OV; pt still smoking and has no desire to quit -Current home readings: n/a -Current treatment: Amlodipine 5 mg daily AM - Appropriate, Effective, Safe, Accessible HCTZ 25 mg daily AM - Appropriate, Effective, Safe, Accessible -Medications previously tried: n/a  -Educated on BP goals and benefits of medications for prevention of heart attack, stroke and kidney damage;  -Counseled to monitor BP at home periodically -Recommended to continue current medication  Hyperlipidemia: (LDL goal < 70) -Controlled - LDL 79 (02/2022) near goal -Hx PAD s/p iliac stenting; Hx CAD (abnormal stress test 2014) -Current treatment: Rosuvastatin 20 mg daily HS - Appropriate, Effective, Safe, Accessible Clopidogrel 75 mg daily AM -  Appropriate, Effective, Safe, Accessible -Medications previously tried: atorvastatin  -Educated on Cholesterol goals; Benefits of statin for ASCVD risk reduction; -Recommended to continue current medication; repeat lipid panel with next physical  COPD (Goal: control symptoms and prevent exacerbations) -Controlled without inhalers currently -Pulmonary function testing: not on file -Gold Grade: unknown -Current COPD Classification:  A (low sx, <2 exacerbations/yr) -MMRC/CAT score: not on file -Exacerbations requiring treatment in last 6 months: 0  Tobacco use (Goal: cessation) -Not ideally controlled- pt smokes 17 cigarettes per day.  -Previous quit attempts: none -pt reports MOTIVATION to quit is low -Discussed impact of nicotine on BP; discussed benefits of quitting; pt is not ready to quit  Chronic Kidney Disease Stage 3b -All medications assessed for renal dosing and appropriateness in chronic kidney disease. -on Vitamin D 50,000 IU weekly for elevated PTH - Appropriate, Effective, Safe, Accessible -Recommended to continue current medication  Health Maintenance -Vaccine gaps: Prevnar, TD, Shingrix - pt prefers no vaccines  Patient Goals/Self-Care Activities Patient will:  - take medications as prescribed as evidenced by patient report and record review focus on medication adherence by routine check blood pressure daily, document, and provide at future appointments      Patient verbalizes understanding of instructions and care plan provided today and agrees to view in [name]. Active MyChart status and patient understanding of how to access instructions and care plan via MyChart confirmed with patient.    Transition CCM to Self Care: Patient achieved CCM goals and no longer needs to be contacted as frequently. Patient advised services will still be available to them if they would like to reach out or have any new health concerns. Verified patient had contact information to  pharmacist and health concierge  on hand. Patient made aware CCM services would be continued if desired. Patient consented to cancel future CCM appointments.  [name], PharmD, BCACP Clinical Pharmacist [facility] [phone]

## 2022-03-30 ENCOUNTER — Telehealth: Payer: Self-pay | Admitting: Acute Care

## 2022-03-30 NOTE — Telephone Encounter (Signed)
Contacted patient by phone to advise Medicare will not accept him under the LCS program due to age 81 years.  An alternative scan can be ordered by PCP, if recommended, as a CT Chest wo contrast.  Patient is interested in having the CT chest if PCP agrees as a diagnostic exam.  Message routed to Dr. Silvio Pate for review and recommendation. Patient advised to call PCP office in one week if he has not received a call.

## 2022-03-31 NOTE — Telephone Encounter (Signed)
Spoke to pt. He said he is not having any breathing issues. He will let us know he has any issues and move forward with a CXR. Also, made him an appt with Dr Silvio Pate 04-15-22 for hip pain management.

## 2022-04-15 ENCOUNTER — Encounter: Payer: Self-pay | Admitting: Internal Medicine

## 2022-04-15 ENCOUNTER — Telehealth: Payer: Self-pay

## 2022-04-15 ENCOUNTER — Ambulatory Visit (INDEPENDENT_AMBULATORY_CARE_PROVIDER_SITE_OTHER): Payer: Medicare Other | Admitting: Internal Medicine

## 2022-04-15 DIAGNOSIS — I2510 Atherosclerotic heart disease of native coronary artery without angina pectoris: Secondary | ICD-10-CM

## 2022-04-15 DIAGNOSIS — M7061 Trochanteric bursitis, right hip: Secondary | ICD-10-CM | POA: Diagnosis not present

## 2022-04-15 MED ORDER — ACETAMINOPHEN-CODEINE 300-30 MG PO TABS: 1.0000 | ORAL_TABLET | Freq: Every evening | ORAL | 0 refills | Status: DC | PRN

## 2022-04-15 NOTE — Progress Notes (Signed)
Subjective:    Patient ID: Sean [name], male    DOB: 1941/05/23, 81 y.o.   MRN: [id number]  HPI Here due to worsening right hip pain  "Its hurting like hell" Goes back 2 months or so Affects his walking and even tough sleeping  Ibuprofen helps but knows he can't take due to kidneys Bengay not helping much Extra strength tylenol---didn't help  No fall or injury Did have some problems 5 years ago--glucosamine/chondroitin helped (but not this time)  Current Outpatient Medications on File Prior to Visit  Medication Sig Dispense Refill   amLODipine (NORVASC) 5 MG tablet TAKE 1 TABLET (5 MG TOTAL) BY MOUTH DAILY. 90 tablet 3   Cholecalciferol (VITAMIN D3) 1.25 MG (50000 UT) CAPS Take 1 capsule by mouth once a week.     clopidogrel (PLAVIX) 75 MG tablet Take 1 tablet (75 mg total) by mouth daily. 90 tablet 2   hydrochlorothiazide (HYDRODIURIL) 25 MG tablet Take 1 tablet (25 mg total) by mouth daily. 90 tablet 2   rosuvastatin (CRESTOR) 20 MG tablet Take 1 tablet (20 mg total) by mouth daily. 90 tablet 3   No current facility-administered medications on file prior to visit.    No Known Allergies  Past Medical History:  Diagnosis Date   Arthritis    Cervical disc disease    COPD (chronic obstructive pulmonary disease) (HCC)    ED (erectile dysfunction)    Hyperlipidemia    PAD (peripheral artery disease) ([place]) 02/05/2013   05/14: Subtotally occluded right external iliac artery, long occlusion of the left proximal to mid SFA with 1 vessel runoff below the knee, diffusely diseased right SFA with short occlusion in the midsegment with three-vessel runoff below the knee. Status post self-expanding stent placement to the right external iliac artery.   Peptic ulcer    Substance abuse [facility])     Past Surgical History:  Procedure Laterality Date   ABDOMINAL AORTAGRAM N/A 02/13/2013   Procedure: ABDOMINAL Maxcine Ham;  Surgeon: Wellington Hampshire, MD;  Location: [place] CATH LAB;  Service:  Cardiovascular;  Laterality: N/A;   CERVICAL DISC SURGERY  1996   HARVEST BONE MARROW     Donor   ILIAC ARTERY STENT     right stent placement   LOWER EXTREMITY ANGIOGRAM  02/13/2013   Procedure: LOWER EXTREMITY ANGIOGRAM;  Surgeon: Wellington Hampshire, MD;  Location: [place] CATH LAB;  Service: Cardiovascular;;   PERCUTANEOUS STENT INTERVENTION Right 02/13/2013   Procedure: PERCUTANEOUS STENT INTERVENTION;  Surgeon: Wellington Hampshire, MD;  Location: [place] CATH LAB;  Service: Cardiovascular;  Laterality: Right;  rt ext iliac stent   STOMACH SURGERY  1974   gastric ulcer    Family History  Problem Relation Age of Onset   Clotting disorder Mother        blood clot   Heart disease Father    Arthritis Sister    Leukemia Son        bone marrow transplant   Hypertension Neg Hx    Colon cancer Neg Hx    Stomach cancer Neg Hx     Social History   Socioeconomic History   Marital status: Married    Spouse name: Not on file   Number of children: 7   Years of education: Not on file   Highest education level: Not on file  Occupational History   Occupation: retired Games developer   Tobacco Use   Smoking status: Every Day    Packs/day: 0.75    Years:  54.00    Total pack years: 40.50    Types: Cigarettes    Start date: 09/12/1966    Passive exposure: Past   Smokeless tobacco: Never   Tobacco comments:    Smokes ~17 cigarettes per day  Substance and Sexual Activity   Alcohol use: No    Comment: rarely   Drug use: Yes    Frequency: 7.0 times per week    Types: Marijuana   Sexual activity: Not on file  Other Topics Concern   Not on file  Social History Narrative   No living will   Requests that wife make medical decisions for him if he is unable.   Alternate would be son Beverely Low   Would accept CPR.    Would accept mechanical ventilation.    Would accept feeding tube   Social Determinants of Health   Financial Resource Strain: Low Risk  (10/13/2021)   Overall Financial Resource Strain (CARDIA)     Difficulty of Paying Living Expenses: Not hard at all  Food Insecurity: Not on file  Transportation Needs: No Transportation Needs (10/13/2021)   PRAPARE - Hydrologist (Medical): No    Lack of Transportation (Non-Medical): No  Physical Activity: Not on file  Stress: Not on file  Social Connections: Not on file  Intimate Partner Violence: Not on file   Review of Systems Not eating well due to the pain    Objective:   Physical Exam Constitutional:      Appearance: Normal appearance.  Cardiovascular:     Comments: Faint pedal pulses in feet Musculoskeletal:     Comments: No hip tenderness other than over the greater trochanter ROM fairly normal  Neurological:     Mental Status: He is alert.     Comments: Normal gait            Assessment & Plan:

## 2022-04-15 NOTE — Telephone Encounter (Signed)
Prior auth started for Acetaminophen-Codeine 300-'30MG'$  tablets. Izaah Kaminski [employer] - PA Case ID: [id number] - Rx #: [id number] Waiting for determination.

## 2022-04-15 NOTE — Patient Instructions (Signed)
Try over the counter diclofenac gel three times a day over the painful area. You can also try 5-10 minutes of ice when it is hurting. If this continues, let me know and I will set you up with an orthopedist

## 2022-04-15 NOTE — Assessment & Plan Note (Signed)
Discussed trying diclofenac gel tid Ice intermittently If persists, will send to ortho to consider cortisone shot

## 2022-04-18 NOTE — Telephone Encounter (Signed)
Prior auth for Acetaminophen-Codeine 300-'30MG'$  tablets has been approved. Sean [name] [employer] - PA Case ID: [id number] - Rx #: [id number] Approved on August 4 Your PA request has been approved. After reviewing your prescription benefit plan's established and approved criteria for this drug with your doctor, the request was approved for the following time period:  04/15/2022-05/15/2022.  Approval letter sent to scanning.  Called patient to notify him this was approved.  He has already picked up medication.

## 2022-05-11 DIAGNOSIS — R808 Other proteinuria: Secondary | ICD-10-CM | POA: Diagnosis not present

## 2022-05-11 DIAGNOSIS — N281 Cyst of kidney, acquired: Secondary | ICD-10-CM | POA: Diagnosis not present

## 2022-05-11 DIAGNOSIS — N1832 Chronic kidney disease, stage 3b: Secondary | ICD-10-CM | POA: Diagnosis not present

## 2022-05-11 DIAGNOSIS — E785 Hyperlipidemia, unspecified: Secondary | ICD-10-CM | POA: Diagnosis not present

## 2022-05-11 DIAGNOSIS — I10 Essential (primary) hypertension: Secondary | ICD-10-CM | POA: Diagnosis not present

## 2022-05-11 DIAGNOSIS — R3129 Other microscopic hematuria: Secondary | ICD-10-CM | POA: Diagnosis not present

## 2022-05-11 DIAGNOSIS — J449 Chronic obstructive pulmonary disease, unspecified: Secondary | ICD-10-CM | POA: Diagnosis not present

## 2022-05-11 DIAGNOSIS — N2581 Secondary hyperparathyroidism of renal origin: Secondary | ICD-10-CM | POA: Diagnosis not present

## 2022-05-19 DIAGNOSIS — E785 Hyperlipidemia, unspecified: Secondary | ICD-10-CM | POA: Diagnosis not present

## 2022-05-19 DIAGNOSIS — N281 Cyst of kidney, acquired: Secondary | ICD-10-CM | POA: Diagnosis not present

## 2022-05-19 DIAGNOSIS — R808 Other proteinuria: Secondary | ICD-10-CM | POA: Diagnosis not present

## 2022-05-19 DIAGNOSIS — N1832 Chronic kidney disease, stage 3b: Secondary | ICD-10-CM | POA: Diagnosis not present

## 2022-05-19 DIAGNOSIS — N2581 Secondary hyperparathyroidism of renal origin: Secondary | ICD-10-CM | POA: Diagnosis not present

## 2022-05-19 DIAGNOSIS — I10 Essential (primary) hypertension: Secondary | ICD-10-CM | POA: Diagnosis not present

## 2022-06-24 ENCOUNTER — Other Ambulatory Visit: Payer: Self-pay | Admitting: Cardiovascular Disease

## 2022-09-15 DIAGNOSIS — E785 Hyperlipidemia, unspecified: Secondary | ICD-10-CM | POA: Diagnosis not present

## 2022-09-15 DIAGNOSIS — N281 Cyst of kidney, acquired: Secondary | ICD-10-CM | POA: Diagnosis not present

## 2022-09-15 DIAGNOSIS — N2581 Secondary hyperparathyroidism of renal origin: Secondary | ICD-10-CM | POA: Diagnosis not present

## 2022-09-15 DIAGNOSIS — I10 Essential (primary) hypertension: Secondary | ICD-10-CM | POA: Diagnosis not present

## 2022-09-15 DIAGNOSIS — N1832 Chronic kidney disease, stage 3b: Secondary | ICD-10-CM | POA: Diagnosis not present

## 2022-09-15 DIAGNOSIS — R808 Other proteinuria: Secondary | ICD-10-CM | POA: Diagnosis not present

## 2022-09-29 ENCOUNTER — Other Ambulatory Visit: Payer: Self-pay | Admitting: Cardiovascular Disease

## 2022-11-11 ENCOUNTER — Encounter: Payer: Self-pay | Admitting: Cardiovascular Disease

## 2022-11-11 ENCOUNTER — Ambulatory Visit: Payer: Medicare Other | Attending: Cardiovascular Disease | Admitting: Cardiovascular Disease

## 2022-11-11 VITALS — BP 138/60 | HR 83 | Ht 67.5 in | Wt 156.4 lb

## 2022-11-11 DIAGNOSIS — Z720 Tobacco use: Secondary | ICD-10-CM | POA: Diagnosis not present

## 2022-11-11 DIAGNOSIS — I739 Peripheral vascular disease, unspecified: Secondary | ICD-10-CM | POA: Diagnosis not present

## 2022-11-11 DIAGNOSIS — R0989 Other specified symptoms and signs involving the circulatory and respiratory systems: Secondary | ICD-10-CM

## 2022-11-11 DIAGNOSIS — E785 Hyperlipidemia, unspecified: Secondary | ICD-10-CM

## 2022-11-11 DIAGNOSIS — I10 Essential (primary) hypertension: Secondary | ICD-10-CM

## 2022-11-11 NOTE — Patient Instructions (Signed)
Medication Instructions:  No changes *If you need a refill on your cardiac medications before your next appointment, please call your pharmacy*   Lab Work: None ordered If you have labs (blood work) drawn today and your tests are completely normal, you will receive your results only by: [place] (if you have MyChart) OR A paper copy in the mail If you have any lab test that is abnormal or we need to change your treatment, we will call you to review the results.   Testing/Procedures: Your physician has recommended that you have an AORTA/ILIAC Duplex. This is a noninvasive diagnostic test that uses ultrasound technology to look at the aorta and iliac arteries to detect any restriction to blood flow to the buttocks, groin, legs or feet.  No food after 11PM the night before.  Water is OK. (Don't drink liquids if you have been instructed not to for ANOTHER test). Avoid foods that produce bowel gas, for 24 hours prior to exam (see below). No breakfast, no chewing gum, no smoking or carbonated beverages. Patient may take morning medications with water. Come in for test at least 15 minutes early to register. This will take approximately 60 minutes This will take place at [place] ([place]) [address], [place]  Your physician has requested that you have an ankle brachial index (ABI). During this test an ultrasound and blood pressure cuff are used to evaluate the arteries that supply the arms and legs with blood.  Allow thirty minutes for this exam.  There are no restrictions or special instructions.  This will take place at [place] ([place]) [address], [place]    Follow-Up: At [facility], you and your health needs are our priority.  As part of our continuing mission to provide you with exceptional heart care, we have created designated Provider Care Teams.  These Care Teams include your primary Cardiologist  (physician) and Advanced Practice Providers (APPs -  Physician Assistants and Nurse Practitioners) who all work together to provide you with the care you need, when you need it.  We recommend signing up for the patient portal called "MyChart".  Sign up information is provided on this After Visit Summary.  MyChart is used to connect with patients for Virtual Visits (Telemedicine).  Patients are able to view lab/test results, encounter notes, upcoming appointments, etc.  Non-urgent messages can be sent to your provider as well.   To learn more about what you can do with MyChart, go to [email or web address].    Your next appointment:   12 month(s)  Provider:   You may see Dr. Fletcher Anon or one of the following Advanced Practice Providers on your designated Care Team:   Murray Hodgkins, NP Christell Faith, PA-C Cadence Kathlen Mody, PA-C Gerrie Nordmann, NP

## 2022-11-11 NOTE — Progress Notes (Signed)
Cardiology Office Note   Date:  11/11/2022   ID:  Sean [name] 1941-08-22, MRN [id number]  PCP:  [name], MD  Cardiologist:   [name], MD   Chief Complaint  Patient presents with   Other    12 month f/u no complaints today. Meds reviewed verballyw with pt.      History of Present Illness: Sean [name] is a 82 y.o. male who presents for a followup visit regarding peripheral arterial disease and slightly abnormal stress test..  He has prolonged history of tobacco , hypertension, chronic kidney disease and hyperlipidemia.  He is status post right external iliac artery stent placement in May 2014 for severe claudication. He is known to have long bilateral SFA occlusions  being  managed medically. Nuclear stress test in 2014 showed evidence of a small inferior infarct without significant ischemia. Ejection fraction was 52%.  He has been doing well with no chest pain, shortness of breath or palpitations.  He denies lower extremity claudication.  He continues to smoke and reports inability to quit as he smoked for more than 60 years.  Past Medical History:  Diagnosis Date   Arthritis    Cervical disc disease    COPD (chronic obstructive pulmonary disease) ([place])    ED (erectile dysfunction)    Hyperlipidemia    PAD (peripheral artery disease) ([place]) 02/05/2013   05/14: Subtotally occluded right external iliac artery, long occlusion of the left proximal to mid SFA with 1 vessel runoff below the knee, diffusely diseased right SFA with short occlusion in the midsegment with three-vessel runoff below the knee. Status post self-expanding stent placement to the right external iliac artery.   Peptic ulcer    Substance abuse [facility])     Past Surgical History:  Procedure Laterality Date   ABDOMINAL AORTAGRAM N/A 02/13/2013   Procedure: ABDOMINAL Maxcine Ham;  Surgeon: Wellington Hampshire, MD;  Location: [place] CATH LAB;  Service: Cardiovascular;  Laterality: N/A;   CERVICAL  DISC SURGERY  1996   HARVEST BONE MARROW     Donor   ILIAC ARTERY STENT     right stent placement   LOWER EXTREMITY ANGIOGRAM  02/13/2013   Procedure: LOWER EXTREMITY ANGIOGRAM;  Surgeon: Wellington Hampshire, MD;  Location: [place] CATH LAB;  Service: Cardiovascular;;   PERCUTANEOUS STENT INTERVENTION Right 02/13/2013   Procedure: PERCUTANEOUS STENT INTERVENTION;  Surgeon: Wellington Hampshire, MD;  Location: [place] CATH LAB;  Service: Cardiovascular;  Laterality: Right;  rt ext iliac stent   STOMACH SURGERY  1974   gastric ulcer     Current Outpatient Medications  Medication Sig Dispense Refill   acetaminophen-codeine (TYLENOL #3) 300-30 MG tablet Take 1-2 tablets by mouth at bedtime as needed for moderate pain. 30 tablet 0   amLODipine (NORVASC) 5 MG tablet TAKE 1 TABLET (5 MG TOTAL) BY MOUTH DAILY. 90 tablet 3   Cholecalciferol (VITAMIN D3) 1.25 MG (50000 UT) CAPS Take 1 capsule by mouth once a week.     clopidogrel (PLAVIX) 75 MG tablet TAKE 1 TABLET BY MOUTH EVERY DAY 60 tablet 0   hydrochlorothiazide (HYDRODIURIL) 25 MG tablet TAKE 1 TABLET (25 MG TOTAL) BY MOUTH DAILY. 60 tablet 0   rosuvastatin (CRESTOR) 20 MG tablet Take 1 tablet (20 mg total) by mouth daily. 90 tablet 3   No current facility-administered medications for this visit.    Allergies:   Patient has no known allergies.    Social History:  The patient  reports that he has been smoking cigarettes. He started smoking about 56 years ago. He has a 40.50 pack-year smoking history. He has been exposed to tobacco smoke. He has never used smokeless tobacco. He reports current drug use. Frequency: 7.00 times per week. Drug: Marijuana. He reports that he does not drink alcohol.   Family History:  The patient's family history includes Arthritis in his sister; Clotting disorder in his mother; Heart disease in his father; Leukemia in his son.    ROS:  Please see the history of present illness.   Otherwise, review of systems are positive for none.    All other systems are reviewed and negative.    PHYSICAL EXAM: VS:  BP 138/60 (BP Location: Left Arm, Patient Position: Sitting, Cuff Size: Normal)   Pulse 83   Ht 5' 7.5" (1.715 m)   Wt 156 lb 6 oz (70.9 kg)   SpO2 98%   BMI 24.13 kg/m  , BMI Body mass index is 24.13 kg/m. GEN: Well nourished, well developed, in no acute distress  HEENT: normal  Neck: no JVD or masses.  Faint right carotid bruit Cardiac: RRR; no murmurs, rubs, or gallops,no edema  Respiratory:  clear to auscultation bilaterally, normal work of breathing GI: soft, nontender, nondistended, + BS MS: no deformity or atrophy  Skin: warm and dry, no rash Neuro:  Strength and sensation are intact Psych: euthymic mood, full affect   EKG:  EKG is ordered today. The ekg ordered today demonstrates normal sinus rhythm with left axis deviation.   Recent Labs: No results found for requested labs within last 365 days.    Lipid Panel    Component Value Date/Time   CHOL 128 01/29/2021 1223   CHOL 156 06/23/2014 1129   TRIG 121.0 01/29/2021 1223   TRIG 134 06/23/2014 1129   HDL 32.50 (L) 01/29/2021 1223   HDL 37 (L) 06/23/2014 1129   CHOLHDL 4 01/29/2021 1223   VLDL 24.2 01/29/2021 1223   VLDL 27 06/23/2014 1129   LDLCALC 72 01/29/2021 1223   LDLCALC 92 06/23/2014 1129   LDLDIRECT 181.8 01/16/2013 1027      Wt Readings from Last 3 Encounters:  11/11/22 156 lb 6 oz (70.9 kg)  04/15/22 150 lb (68 kg)  02/01/22 158 lb (71.7 kg)       ASSESSMENT AND PLAN:  1.  Peripheral arterial disease: Status post stenting of the right external iliac artery with known bilateral SFA occlusion which is being managed medically.  Currently with no significant leg claudication.  I requested a follow-up ABI and aortoiliac duplex.  Continue dual antiplatelet therapy as tolerated.  2. Presumed coronary artery disease based on abnormal stress test in 2014 which showed prior small inferior infarct. Currently with no anginal  symptoms. Continue medical therapy.  3. Hyperlipidemia: Continue treatment with rosuvastatin 20 mg once daily with a target LDL of less than 70.  He is due for a physical with Dr. Silvio Pate and likely will get labs done at that time.  4. Tobacco use: He reports inability to quit smoking.  5. Essential hypertension: Blood pressure is well-controlled on amlodipine and hydrochlorothiazide.  6.  Right carotid bruit: Previous carotid Doppler showed no significant disease.    Disposition:   FU with me in 1 year  Signed,  [name], MD  11/11/2022 9:00 AM    [place]

## 2022-11-21 ENCOUNTER — Other Ambulatory Visit: Payer: Self-pay | Admitting: Cardiovascular Disease

## 2022-11-22 IMAGING — US US RENAL
2 series · 14 of 25 positions shown · non-contrast
Comparison: None.

CLINICAL DATA: Chronic renal disease

EXAM:
RENAL / URINARY TRACT ULTRASOUND COMPLETE

[Series 1: us renal · 0.22mm/px · 13 of 36 slices shown (1 of 2)]
[im 1/36]
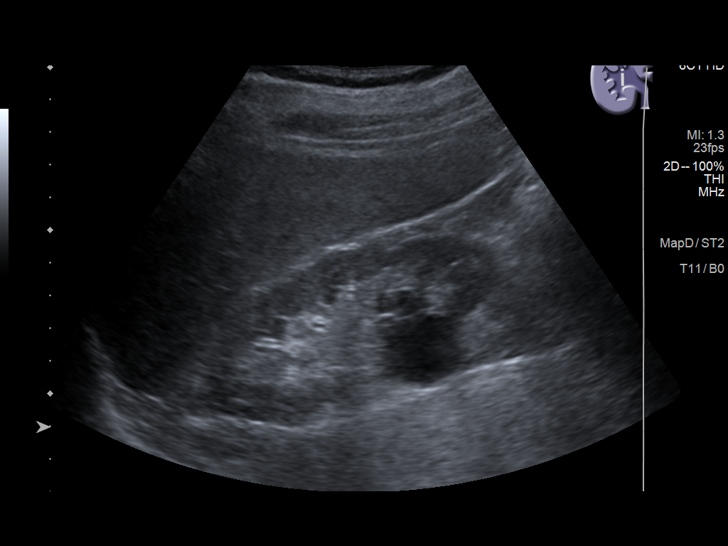
[im 4/36]
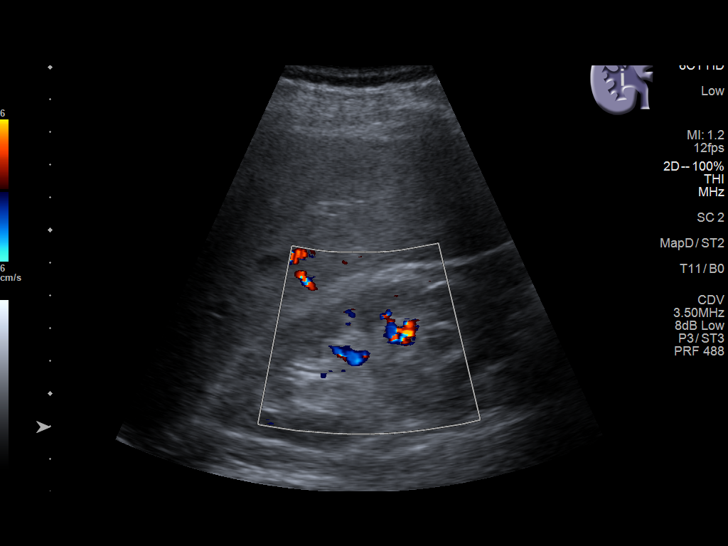
[im 7/36]
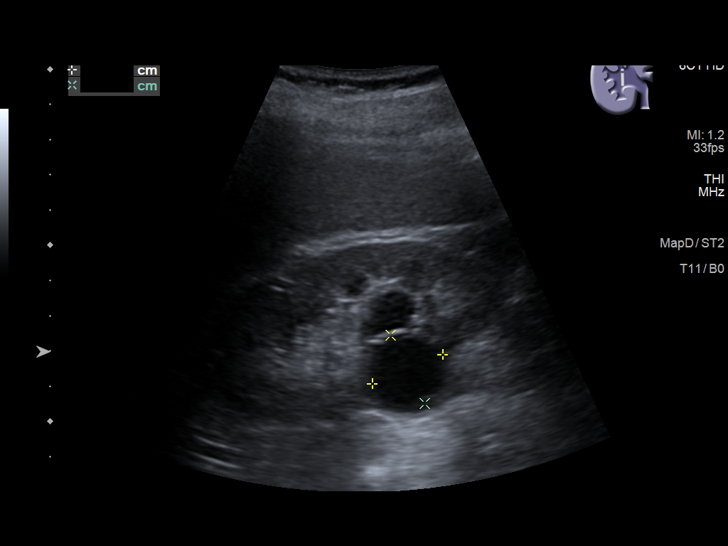
[im 10/36]
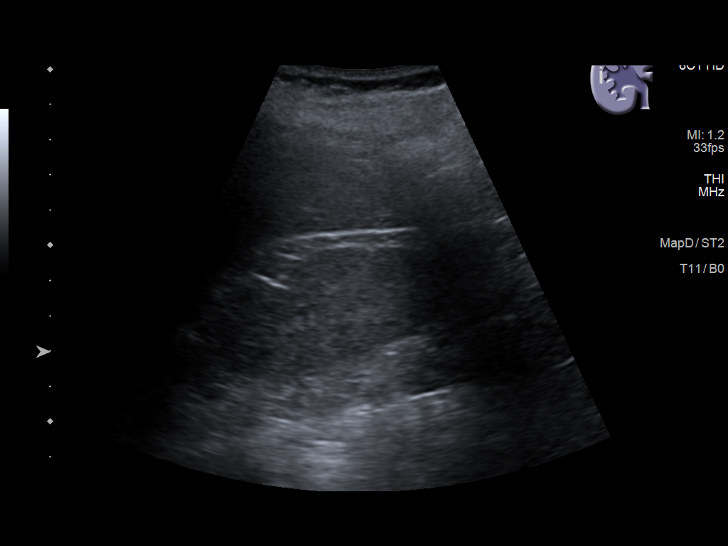
[im 13/36]
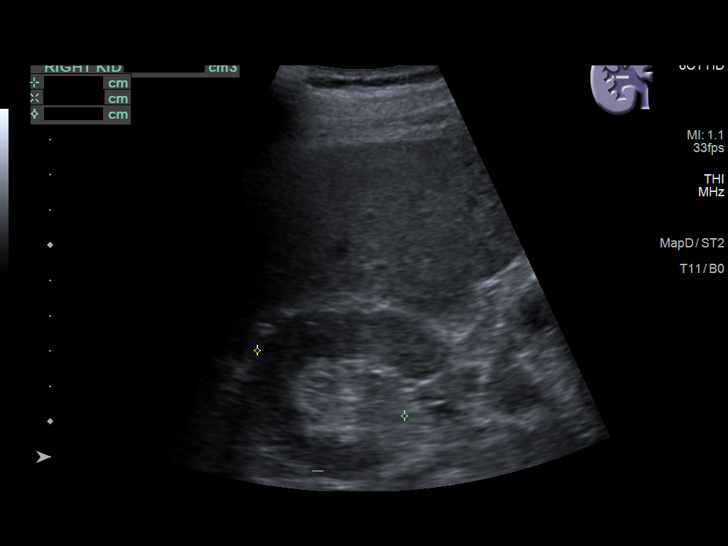
[im 15/36]
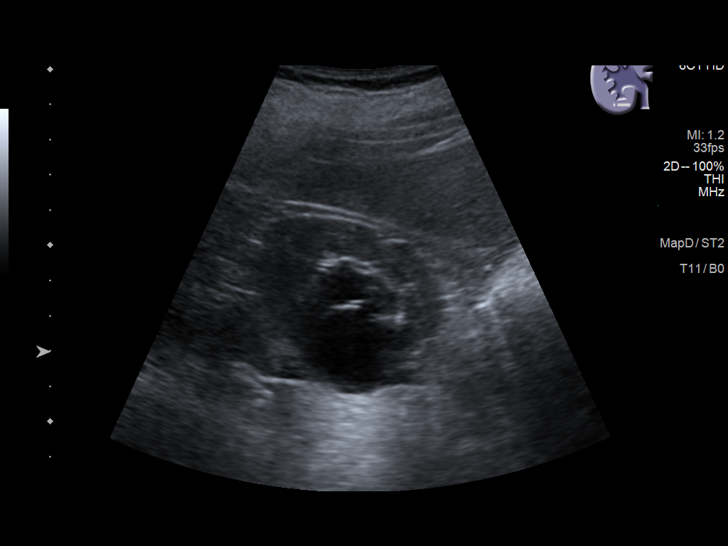
[im 18/36]
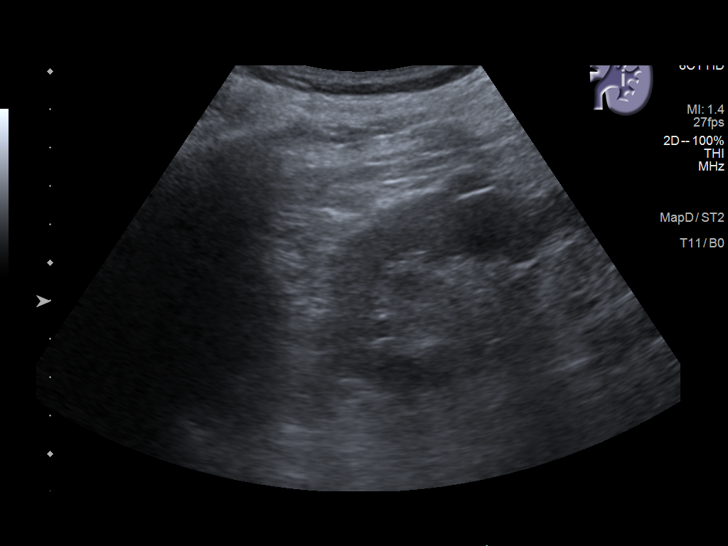
[im 21/36]
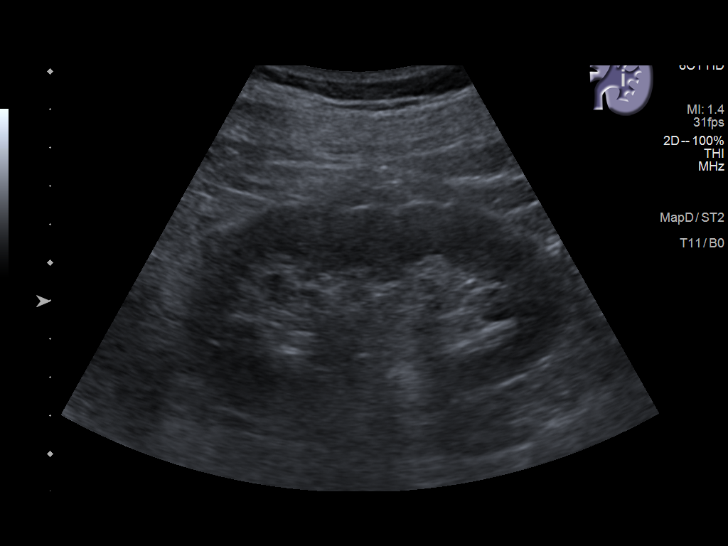
[im 24/36]
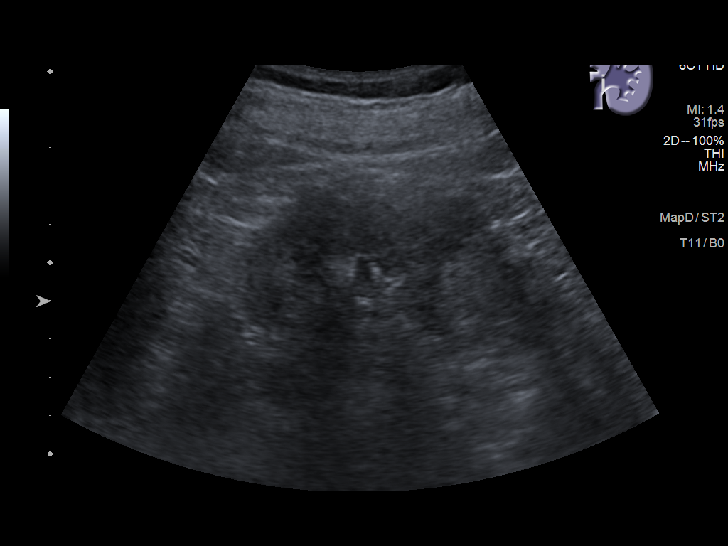
[im 26/36]
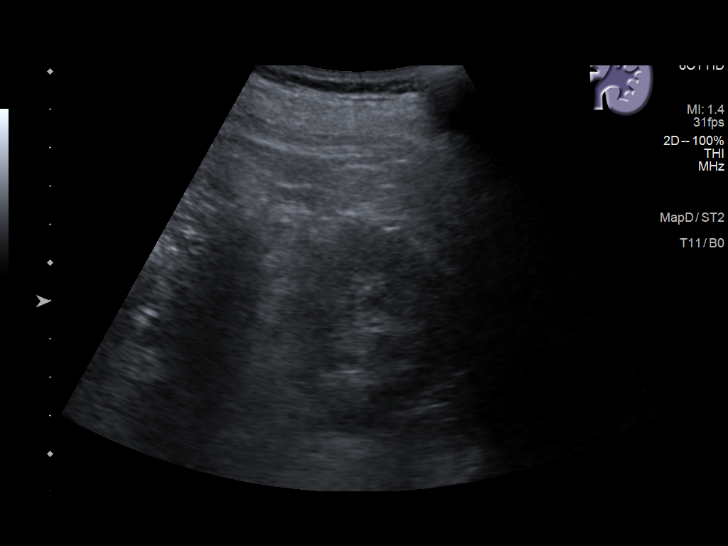
[im 29/36]
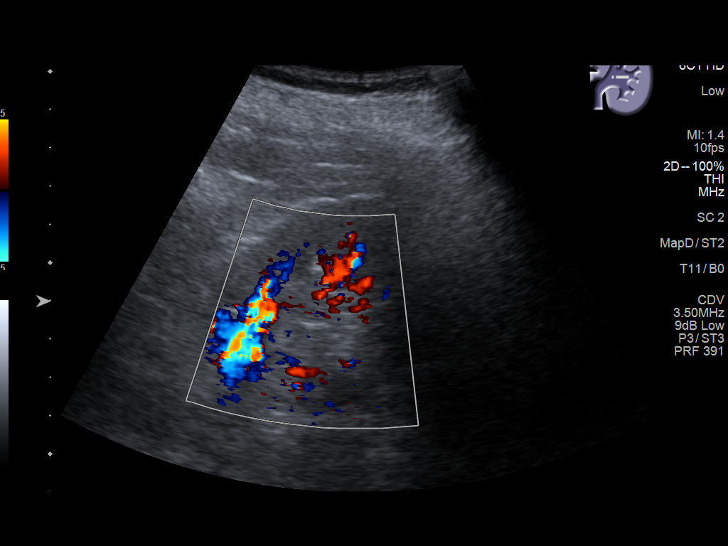
[im 32/36]
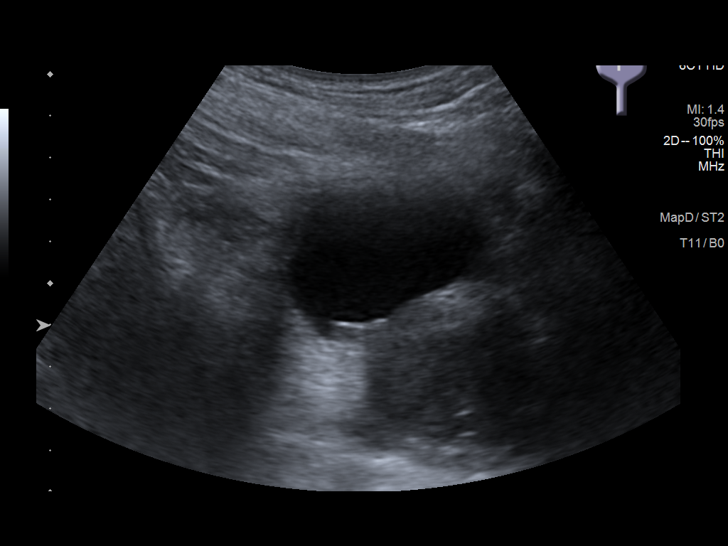
[im 36/36]
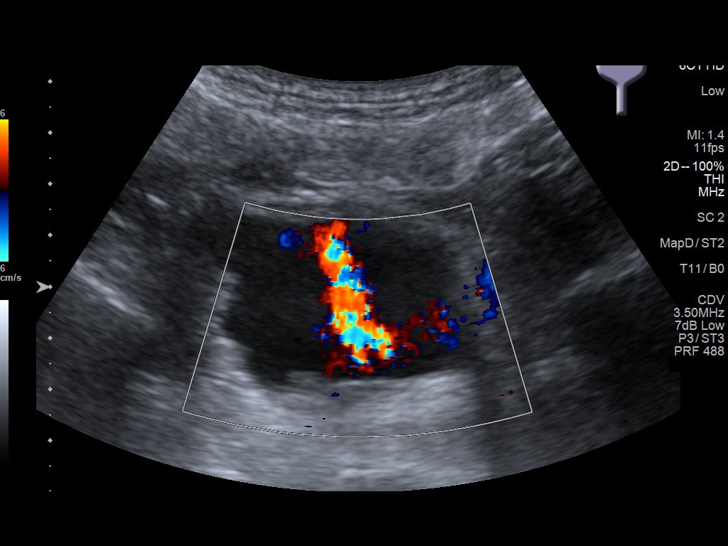

[Series 2001: us renal · 0.20mm/px · 1 of 4 slices shown (2 of 2)]
[im 4/4]
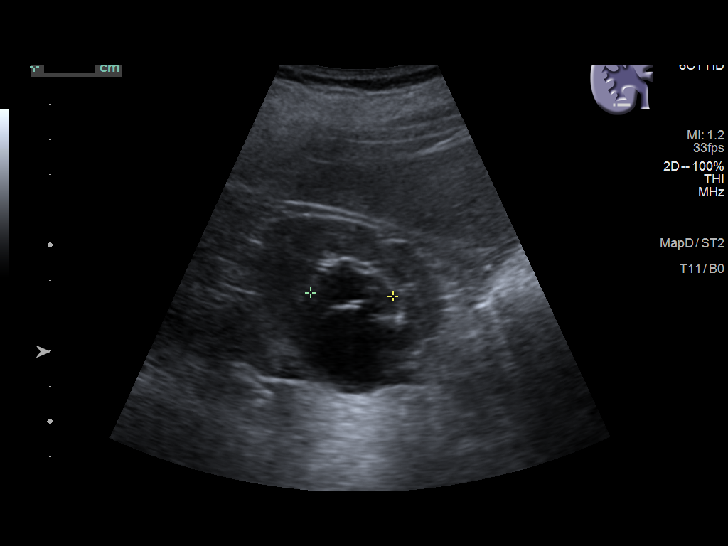

[14 of 25 positions shown; findings below may reference images not displayed]

FINDINGS: Right Kidney:

Renal measurements: 9.7 x 4.5 x 4.6 cm. = volume: 1 3 mL. No
evidence of mass lesion or hydronephrosis is noted. Central cysts
are noted. The largest of these measures 2.5 cm in greatest
dimension. A smaller adjacent 1.5 cm cyst is noted.

Left Kidney:

Renal measurements: 9.9 x 5.2 x 4.6 cm. = volume: 125 mL.
Echogenicity within normal limits. No mass or hydronephrosis
visualized.

Bladder:

Appears normal for degree of bladder distention.

Other:

None.
IMPRESSION: No mass lesion or hydronephrosis is noted.

Small central simple cysts are noted on the right as described.

## 2022-12-05 ENCOUNTER — Other Ambulatory Visit: Payer: Self-pay | Admitting: Cardiovascular Disease

## 2022-12-15 DIAGNOSIS — N2581 Secondary hyperparathyroidism of renal origin: Secondary | ICD-10-CM | POA: Diagnosis not present

## 2022-12-15 DIAGNOSIS — R808 Other proteinuria: Secondary | ICD-10-CM | POA: Diagnosis not present

## 2022-12-15 DIAGNOSIS — N1832 Chronic kidney disease, stage 3b: Secondary | ICD-10-CM | POA: Diagnosis not present

## 2022-12-15 DIAGNOSIS — I10 Essential (primary) hypertension: Secondary | ICD-10-CM | POA: Diagnosis not present

## 2022-12-15 DIAGNOSIS — E785 Hyperlipidemia, unspecified: Secondary | ICD-10-CM | POA: Diagnosis not present

## 2022-12-15 DIAGNOSIS — N281 Cyst of kidney, acquired: Secondary | ICD-10-CM | POA: Diagnosis not present

## 2022-12-22 ENCOUNTER — Other Ambulatory Visit: Payer: Self-pay | Admitting: Cardiovascular Disease

## 2022-12-22 DIAGNOSIS — I739 Peripheral vascular disease, unspecified: Secondary | ICD-10-CM

## 2023-01-03 ENCOUNTER — Ambulatory Visit: Payer: Medicare Other | Attending: Cardiovascular Disease

## 2023-01-03 ENCOUNTER — Other Ambulatory Visit: Payer: Self-pay | Admitting: Cardiovascular Disease

## 2023-01-03 ENCOUNTER — Ambulatory Visit (INDEPENDENT_AMBULATORY_CARE_PROVIDER_SITE_OTHER): Payer: Medicare Other

## 2023-01-03 DIAGNOSIS — I739 Peripheral vascular disease, unspecified: Secondary | ICD-10-CM

## 2023-01-03 DIAGNOSIS — Z95828 Presence of other vascular implants and grafts: Secondary | ICD-10-CM

## 2023-01-03 LAB — VAS US ABI WITH/WO TBI: Left ABI: 0.74

## 2023-01-04 LAB — VAS US ABI WITH/WO TBI: Right ABI: 0.66

## 2023-01-05 ENCOUNTER — Other Ambulatory Visit: Payer: Self-pay | Admitting: *Deleted

## 2023-01-05 DIAGNOSIS — I739 Peripheral vascular disease, unspecified: Secondary | ICD-10-CM

## 2023-02-07 ENCOUNTER — Ambulatory Visit (INDEPENDENT_AMBULATORY_CARE_PROVIDER_SITE_OTHER): Payer: Medicare Other | Admitting: Internal Medicine

## 2023-02-07 ENCOUNTER — Encounter: Payer: Self-pay | Admitting: Internal Medicine

## 2023-02-07 VITALS — BP 128/86 | HR 74 | Temp 97.9°F | Ht 66.5 in | Wt 154.0 lb

## 2023-02-07 DIAGNOSIS — I2510 Atherosclerotic heart disease of native coronary artery without angina pectoris: Secondary | ICD-10-CM

## 2023-02-07 DIAGNOSIS — I10 Essential (primary) hypertension: Secondary | ICD-10-CM | POA: Diagnosis not present

## 2023-02-07 DIAGNOSIS — Z0000 Encounter for general adult medical examination without abnormal findings: Secondary | ICD-10-CM

## 2023-02-07 DIAGNOSIS — N1832 Chronic kidney disease, stage 3b: Secondary | ICD-10-CM

## 2023-02-07 DIAGNOSIS — I739 Peripheral vascular disease, unspecified: Secondary | ICD-10-CM

## 2023-02-07 DIAGNOSIS — N2581 Secondary hyperparathyroidism of renal origin: Secondary | ICD-10-CM

## 2023-02-07 DIAGNOSIS — J449 Chronic obstructive pulmonary disease, unspecified: Secondary | ICD-10-CM

## 2023-02-07 NOTE — Assessment & Plan Note (Signed)
BP Readings from Last 3 Encounters:  02/07/23 128/86  11/11/22 138/60  04/15/22 138/76   Controlled on amlodipine 5mg  and HCTZ 25 daily

## 2023-02-07 NOTE — Assessment & Plan Note (Signed)
No angina 

## 2023-02-07 NOTE — Assessment & Plan Note (Signed)
Is on vitamin D 

## 2023-02-07 NOTE — Assessment & Plan Note (Signed)
I have personally reviewed the Medicare Annual Wellness questionnaire and have noted 1. The patient's medical and social history 2. Their use of alcohol, tobacco or illicit drugs 3. Their current medications and supplements 4. The patient's functional ability including ADL's, fall risks, home safety risks and hearing or visual             impairment. 5. Diet and physical activities 6. Evidence for depression or mood disorders  The patients weight, height, BMI and visual acuity have been recorded in the chart I have made referrals, counseling and provided education to the patient based review of the above and I have provided the pt with a written personalized care plan for preventive services.  I have provided you with a copy of your personalized plan for preventive services. Please take the time to review along with your updated medication list.  Does exercise regularly Discussed considering one last colonoscopy---he prefers not due to age No PSA due to age Prefers no vaccines--discussed Td with injury

## 2023-02-07 NOTE — Progress Notes (Signed)
Vision Screening   Right eye Left eye Both eyes  Without correction 20/70 20/30 20/25   With correction     Hearing Screening - Comments:: Passed whisper test

## 2023-02-07 NOTE — Assessment & Plan Note (Signed)
Urged him to at least cut back No meds for now

## 2023-02-07 NOTE — Assessment & Plan Note (Signed)
GFR has improved recently--likely mostly dietary changes

## 2023-02-07 NOTE — Progress Notes (Signed)
Subjective:    Patient ID: Sean [name], male    DOB: 05-May-1941, 82 y.o.   MRN: [id number]  HPI Here for Medicare wellness visit and follow up of chronic health conditions Reviewed advanced directives Reviewed other doctors---Dr Arida---cardiology/vascular, Dr Stormy Card No hospitalizations or surgery in the past year Does stretching and elliptical daily (30 minutes) Vision is not great---needs exam Hearing is fine Still smokes No alcohol No falls No depression or anhedonia Independent with instrumental ADLs No sig memory issues  Doing well Bursitis better with the diclofenac gel---briefly only No claudication  Is able to walk more with his hip better Still smoking--has cut down No cough Still gets DOE---but has improved  Now seeing nephrologist GFR recently done---up to 41 Has really changed his diet  No chest pain No palpitations No dizziness or syncope No edema  Current Outpatient Medications on File Prior to Visit  Medication Sig Dispense Refill   acetaminophen-codeine (TYLENOL #3) 300-30 MG tablet Take 1-2 tablets by mouth at bedtime as needed for moderate pain. 30 tablet 0   amLODipine (NORVASC) 5 MG tablet TAKE 1 TABLET (5 MG TOTAL) BY MOUTH DAILY. 90 tablet 3   clopidogrel (PLAVIX) 75 MG tablet TAKE 1 TABLET BY MOUTH EVERY DAY 90 tablet 1   hydrochlorothiazide (HYDRODIURIL) 25 MG tablet TAKE 1 TABLET (25 MG TOTAL) BY MOUTH DAILY. 90 tablet 1   rosuvastatin (CRESTOR) 20 MG tablet TAKE 1 TABLET BY MOUTH EVERY DAY 90 tablet 3   No current facility-administered medications on file prior to visit.    No Known Allergies  Past Medical History:  Diagnosis Date   Arthritis    Cervical disc disease    COPD (chronic obstructive pulmonary disease) (HCC)    ED (erectile dysfunction)    Hyperlipidemia    PAD (peripheral artery disease) (HCC) 02/05/2013   05/14: Subtotally occluded right external iliac artery, long occlusion of the left proximal to  mid SFA with 1 vessel runoff below the knee, diffusely diseased right SFA with short occlusion in the midsegment with three-vessel runoff below the knee. Status post self-expanding stent placement to the right external iliac artery.   Peptic ulcer    Substance abuse [facility])     Past Surgical History:  Procedure Laterality Date   ABDOMINAL AORTAGRAM N/A 02/13/2013   Procedure: ABDOMINAL Ronny Flurry;  Surgeon: Iran Ouch, MD;  Location: [facility] CATH [facility];  Service: Cardiovascular;  Laterality: N/A;   CERVICAL DISC SURGERY  1996   HARVEST BONE MARROW     Donor   ILIAC ARTERY STENT     right stent placement   LOWER EXTREMITY ANGIOGRAM  02/13/2013   Procedure: LOWER EXTREMITY ANGIOGRAM;  Surgeon: Iran Ouch, MD;  Location: [facility];  Service: Cardiovascular;;   PERCUTANEOUS STENT INTERVENTION Right 02/13/2013   Procedure: PERCUTANEOUS STENT INTERVENTION;  Surgeon: Iran Ouch, MD;  Location: [facility];  Service: Cardiovascular;  Laterality: Right;  rt ext iliac stent   STOMACH SURGERY  1974   gastric ulcer    Family History  Problem Relation Age of Onset   Clotting disorder Mother        blood clot   Heart disease Father    Arthritis Sister    Leukemia Son        bone marrow transplant   Hypertension Neg Hx    Colon cancer Neg Hx    Stomach cancer Neg Hx     Social History   Socioeconomic History   Marital  status: Married    Spouse name: Not on file   Number of children: 7   Years of education: Not on file   Highest education level: Not on file  Occupational History   Occupation: retired Music therapist   Tobacco Use   Smoking status: Every Day    Packs/day: 0.75    Years: 54.00    Additional pack years: 0.00    Total pack years: 40.50    Types: Cigarettes    Start date: 09/12/1966    Passive exposure: Past   Smokeless tobacco: Never   Tobacco comments:    Smokes ~17 cigarettes per day  Substance and Sexual Activity   Alcohol use: No    Comment: rarely   Drug use:  Yes    Frequency: 7.0 times per week    Types: Marijuana   Sexual activity: Not on file  Other Topics Concern   Not on file  Social History Narrative   No living will   Requests that wife make medical decisions for him if he is unable.   Alternate would be son Berna Spare   Would accept CPR.    Would accept mechanical ventilation.    Would accept feeding tube   Social Determinants of Health   Financial Resource Strain: Low Risk  (10/13/2021)   Overall Financial Resource Strain (CARDIA)    Difficulty of Paying Living Expenses: Not hard at all  Food Insecurity: Not on file  Transportation Needs: No Transportation Needs (10/13/2021)   PRAPARE - Administrator, Civil Service (Medical): No    Lack of Transportation (Non-Medical): No  Physical Activity: Not on file  Stress: Not on file  Social Connections: Not on file  Intimate Partner Violence: Not on file   Review of Systems Appetite is good Has lost some weight---eating better Sleeps okay Wears seat belt Upper and lower dentures No heartburn. Did have slight trouble swallowing tylenol Bowels move fine---no blood Voids okay--stream is slow. Empties okay No sig back or joint pains now No suspicious skin lesions--does have bump on right forearm (cyst)    Objective:   Physical Exam Constitutional:      Appearance: Normal appearance.  HENT:     Mouth/Throat:     Pharynx: No oropharyngeal exudate or posterior oropharyngeal erythema.  Eyes:     Conjunctiva/sclera: Conjunctivae normal.     Pupils: Pupils are equal, round, and reactive to light.  Cardiovascular:     Rate and Rhythm: Normal rate and regular rhythm.     Heart sounds: No murmur heard.    No gallop.     Comments: Feet warm without palpable pulses Pulmonary:     Effort: Pulmonary effort is normal.     Breath sounds: No wheezing or rales.     Comments: Decreased breath sounds but clear Abdominal:     Palpations: Abdomen is soft.     Tenderness: There is  no abdominal tenderness.  Musculoskeletal:     Cervical back: Neck supple.     Right lower leg: No edema.     Left lower leg: No edema.  Lymphadenopathy:     Cervical: No cervical adenopathy.  Skin:    Findings: No lesion or rash.  Neurological:     General: No focal deficit present.     Mental Status: He is alert and oriented to person, place, and time.     Comments: Word naming 12/1 minute Recall 3/3  Psychiatric:        Mood and Affect:  Mood normal.        Behavior: Behavior normal.            Assessment & Plan:

## 2023-02-07 NOTE — Assessment & Plan Note (Signed)
No clear claudication since procedure On plavix and rosuvastatin 20

## 2023-02-18 ENCOUNTER — Other Ambulatory Visit: Payer: Self-pay | Admitting: Internal Medicine

## 2023-04-12 DIAGNOSIS — N2581 Secondary hyperparathyroidism of renal origin: Secondary | ICD-10-CM | POA: Diagnosis not present

## 2023-04-12 DIAGNOSIS — N281 Cyst of kidney, acquired: Secondary | ICD-10-CM | POA: Diagnosis not present

## 2023-04-12 DIAGNOSIS — R808 Other proteinuria: Secondary | ICD-10-CM | POA: Diagnosis not present

## 2023-04-12 DIAGNOSIS — E785 Hyperlipidemia, unspecified: Secondary | ICD-10-CM | POA: Diagnosis not present

## 2023-04-12 DIAGNOSIS — N1832 Chronic kidney disease, stage 3b: Secondary | ICD-10-CM | POA: Diagnosis not present

## 2023-04-12 DIAGNOSIS — I10 Essential (primary) hypertension: Secondary | ICD-10-CM | POA: Diagnosis not present

## 2023-04-18 DIAGNOSIS — N1832 Chronic kidney disease, stage 3b: Secondary | ICD-10-CM | POA: Diagnosis not present

## 2023-04-18 DIAGNOSIS — R808 Other proteinuria: Secondary | ICD-10-CM | POA: Diagnosis not present

## 2023-04-18 DIAGNOSIS — I10 Essential (primary) hypertension: Secondary | ICD-10-CM | POA: Diagnosis not present

## 2023-04-18 DIAGNOSIS — E785 Hyperlipidemia, unspecified: Secondary | ICD-10-CM | POA: Diagnosis not present

## 2023-04-18 DIAGNOSIS — N2581 Secondary hyperparathyroidism of renal origin: Secondary | ICD-10-CM | POA: Diagnosis not present

## 2023-04-18 DIAGNOSIS — N281 Cyst of kidney, acquired: Secondary | ICD-10-CM | POA: Diagnosis not present

## 2023-05-13 ENCOUNTER — Other Ambulatory Visit: Payer: Self-pay | Admitting: Cardiovascular Disease

## 2023-08-18 ENCOUNTER — Encounter: Payer: Self-pay | Admitting: Internal Medicine

## 2023-08-18 ENCOUNTER — Ambulatory Visit: Payer: Medicare Other | Admitting: Internal Medicine

## 2023-08-18 ENCOUNTER — Ambulatory Visit (INDEPENDENT_AMBULATORY_CARE_PROVIDER_SITE_OTHER): Payer: Medicare Other | Admitting: Internal Medicine

## 2023-08-18 VITALS — BP 122/70 | HR 132 | Ht 66.5 in

## 2023-08-18 DIAGNOSIS — I10 Essential (primary) hypertension: Secondary | ICD-10-CM

## 2023-08-18 DIAGNOSIS — M7061 Trochanteric bursitis, right hip: Secondary | ICD-10-CM

## 2023-08-18 DIAGNOSIS — R531 Weakness: Secondary | ICD-10-CM | POA: Diagnosis not present

## 2023-08-18 DIAGNOSIS — N1832 Chronic kidney disease, stage 3b: Secondary | ICD-10-CM | POA: Diagnosis not present

## 2023-08-18 LAB — RENAL FUNCTION PANEL
Albumin: 3.6 g/dL (ref 3.5–5.2)
BUN: 59 mg/dL — ABNORMAL HIGH (ref 6–23)
CO2: 25 meq/L (ref 19–32)
Calcium: 9.5 mg/dL (ref 8.4–10.5)
Chloride: 100 meq/L (ref 96–112)
Creatinine, Ser: 1.92 mg/dL — ABNORMAL HIGH (ref 0.40–1.50)
GFR: 32.36 mL/min — ABNORMAL LOW (ref >60.00)
Glucose, Bld: 94 mg/dL (ref 70–99)
Phosphorus: 3.9 mg/dL (ref 2.3–4.6)
Potassium: 4.5 meq/L (ref 3.5–5.1)
Sodium: 140 meq/L (ref 135–145)

## 2023-08-18 LAB — CBC
HCT: 46.2 % (ref 39.0–52.0)
Hemoglobin: 15.3 g/dL (ref 13.0–17.0)
MCHC: 33.2 g/dL (ref 30.0–36.0)
MCV: 81.6 fL (ref 78.0–100.0)
Platelets: 376 10*3/uL (ref 150.0–400.0)
RBC: 5.67 Mil/uL (ref 4.22–5.81)
RDW: 16 % — ABNORMAL HIGH (ref 11.5–15.5)
WBC: 5 10*3/uL (ref 4.0–10.5)

## 2023-08-18 LAB — HEPATIC FUNCTION PANEL
ALT: 12 U/L (ref 0–53)
AST: 22 U/L (ref 0–37)
Albumin: 3.6 g/dL (ref 3.5–5.2)
Alkaline Phosphatase: 114 U/L (ref 39–117)
Bilirubin, Direct: 0.3 mg/dL (ref 0.0–0.3)
Total Bilirubin: 0.8 mg/dL (ref 0.2–1.2)
Total Protein: 7.1 g/dL (ref 6.0–8.3)

## 2023-08-18 LAB — SEDIMENTATION RATE: Sed Rate: 63 mm/h — ABNORMAL HIGH (ref 0–20)

## 2023-08-18 NOTE — Assessment & Plan Note (Signed)
Seems to be the same thing as last year Discussed ice---and continue the diclofenac If not better next week, would try steroid injection

## 2023-08-18 NOTE — Assessment & Plan Note (Signed)
Likely worse now Follows with Dr Suezanne Jacquet

## 2023-08-18 NOTE — Patient Instructions (Signed)
Stop the hydrochlorothiazide Work on increasing your water/fluids

## 2023-08-18 NOTE — Progress Notes (Signed)
Subjective:    Patient ID: Sean [name], male    DOB: 1941/03/18, 82 y.o.   MRN: [id number]  HPI Here due to right hip pain With wife  Feeling weak in the knees and having hip pain Has been using the diclofenac again on the hip---in past few days Not as much help this time Hasn't tried ice Walking with rollator--helps keep him stable Knee trouble goes back years---but has worsened in past 1-2 months----has spent a lot of time walking in hospital hallways visiting family  Appetite is off--not eating well per wife Goes back to before Thanksgiving Has cut back on drinking---and urine is darker Seems to have lost weight  No chest pain No SOB Some dizziness--when he gets up fast  Current Outpatient Medications on File Prior to Visit  Medication Sig Dispense Refill   acetaminophen-codeine (TYLENOL #3) 300-30 MG tablet Take 1-2 tablets by mouth at bedtime as needed for moderate pain. 30 tablet 0   amLODipine (NORVASC) 5 MG tablet TAKE 1 TABLET (5 MG TOTAL) BY MOUTH DAILY. 90 tablet 3   clopidogrel (PLAVIX) 75 MG tablet TAKE 1 TABLET BY MOUTH EVERY DAY 90 tablet 2   hydrochlorothiazide (HYDRODIURIL) 25 MG tablet TAKE 1 TABLET (25 MG TOTAL) BY MOUTH DAILY. 90 tablet 2   rosuvastatin (CRESTOR) 20 MG tablet TAKE 1 TABLET BY MOUTH EVERY DAY 90 tablet 3   No current facility-administered medications on file prior to visit.    No Known Allergies  Past Medical History:  Diagnosis Date   Arthritis    Cervical disc disease    COPD (chronic obstructive pulmonary disease) (HCC)    ED (erectile dysfunction)    Hyperlipidemia    PAD (peripheral artery disease) (HCC) 02/05/2013   05/14: Subtotally occluded right external iliac artery, long occlusion of the left proximal to mid SFA with 1 vessel runoff below the knee, diffusely diseased right SFA with short occlusion in the midsegment with three-vessel runoff below the knee. Status post self-expanding stent placement to the right external  iliac artery.   Peptic ulcer    Substance abuse [facility])     Past Surgical History:  Procedure Laterality Date   ABDOMINAL AORTAGRAM N/A 02/13/2013   Procedure: ABDOMINAL Ronny Flurry;  Surgeon: Iran Ouch, MD;  Location: [facility];  Service: Cardiovascular;  Laterality: N/A;   CERVICAL DISC SURGERY  1996   HARVEST BONE MARROW     Donor   ILIAC ARTERY STENT     right stent placement   LOWER EXTREMITY ANGIOGRAM  02/13/2013   Procedure: LOWER EXTREMITY ANGIOGRAM;  Surgeon: Iran Ouch, MD;  Location: [facility];  Service: Cardiovascular;;   PERCUTANEOUS STENT INTERVENTION Right 02/13/2013   Procedure: PERCUTANEOUS STENT INTERVENTION;  Surgeon: Iran Ouch, MD;  Location: [facility];  Service: Cardiovascular;  Laterality: Right;  rt ext iliac stent   STOMACH SURGERY  1974   gastric ulcer    Family History  Problem Relation Age of Onset   Clotting disorder Mother        blood clot   Heart disease Father    Arthritis Sister    Leukemia Son        bone marrow transplant   Hypertension Neg Hx    Colon cancer Neg Hx    Stomach cancer Neg Hx     Social History   Socioeconomic History   Marital status: Married    Spouse name: Not on file   Number of children: 7  Years of education: Not on file   Highest education level: Not on file  Occupational History   Occupation: retired Music therapist   Tobacco Use   Smoking status: Every Day    Current packs/day: 0.75    Average packs/day: 0.8 packs/day for 56.9 years (42.7 ttl pk-yrs)    Types: Cigarettes    Start date: 09/12/1966    Passive exposure: Past   Smokeless tobacco: Never   Tobacco comments:    Smokes ~17 cigarettes per day  Substance and Sexual Activity   Alcohol use: No    Comment: rarely   Drug use: Yes    Frequency: 7.0 times per week    Types: Marijuana   Sexual activity: Not on file  Other Topics Concern   Not on file  Social History Narrative   No living will   Requests that wife make medical  decisions for him if he is unable.   Alternate would be son Berna Spare   Would accept CPR.    Would accept mechanical ventilation.    Would accept feeding tube   Social Determinants of Health   Financial Resource Strain: Low Risk  (10/13/2021)   Overall Financial Resource Strain (CARDIA)    Difficulty of Paying Living Expenses: Not hard at all  Food Insecurity: Not on file  Transportation Needs: No Transportation Needs (10/13/2021)   PRAPARE - Administrator, Civil Service (Medical): No    Lack of Transportation (Non-Medical): No  Physical Activity: Not on file  Stress: Not on file  Social Connections: Not on file  Intimate Partner Violence: Not on file   Review of Systems Bowels move okay--but some loose (having trouble with some fecal and urinary incontinence) Sleeps okay No sig mood issues Hasn't smoked in past 2 weeks    Objective:   Physical Exam Constitutional:      Appearance: He is ill-appearing.  Cardiovascular:     Rate and Rhythm: Regular rhythm.     Comments: HR close to 100 Pulmonary:     Effort: Pulmonary effort is normal.     Breath sounds: Normal breath sounds. No wheezing or rales.  Musculoskeletal:     Cervical back: Neck supple.     Right lower leg: No edema.     Left lower leg: No edema.     Comments: Normal ROM in right hip---again has localized tenderness over bursa Knees have no swelling or apparent instability  Lymphadenopathy:     Cervical: No cervical adenopathy.  Neurological:     Mental Status: He is alert.     Comments: General weakness  Psychiatric:        Mood and Affect: Mood normal.            Assessment & Plan:

## 2023-08-18 NOTE — Assessment & Plan Note (Signed)
Suspect dehydration Not eating well Discussed increasing fluids--will need to work hard on this since not hungry or thirsty High calorie foods--like nabs, etc Stop the hydrochlorothiazide

## 2023-08-18 NOTE — Assessment & Plan Note (Signed)
BP Readings from Last 3 Encounters:  08/18/23 122/70  02/07/23 128/86  11/11/22 138/60   Is lower now and apparent dehydration Continue the amlodipine 5mg  daily but stop the hydrochlorothiazide

## 2023-08-24 ENCOUNTER — Encounter: Payer: Self-pay | Admitting: Internal Medicine

## 2023-08-24 ENCOUNTER — Ambulatory Visit (INDEPENDENT_AMBULATORY_CARE_PROVIDER_SITE_OTHER): Payer: Medicare Other | Admitting: Internal Medicine

## 2023-08-24 VITALS — BP 122/80 | HR 99 | Ht 66.5 in | Wt 141.0 lb

## 2023-08-24 DIAGNOSIS — I739 Peripheral vascular disease, unspecified: Secondary | ICD-10-CM | POA: Diagnosis not present

## 2023-08-24 DIAGNOSIS — I10 Essential (primary) hypertension: Secondary | ICD-10-CM

## 2023-08-24 DIAGNOSIS — M7061 Trochanteric bursitis, right hip: Secondary | ICD-10-CM

## 2023-08-24 NOTE — Assessment & Plan Note (Signed)
Seems better Discussed seeing Dr Patsy Lager for cortisone injection is it persists

## 2023-08-24 NOTE — Assessment & Plan Note (Signed)
Dehydration seems better Off the hydrochlorothiazide Now just amldpiine 5mg  daily  BP Readings from Last 3 Encounters:  08/24/23 122/80  08/18/23 122/70  02/07/23 128/86

## 2023-08-24 NOTE — Assessment & Plan Note (Signed)
Wife correctly wondered whether his new hip pain could again be vascular Reviewed the April vascular study that looked okay If he does have exertional symptoms--will need to see Dr Kirke Corin again

## 2023-08-24 NOTE — Progress Notes (Signed)
Subjective:    Patient ID: Sean [name], male    DOB: 1940-12-16, 82 y.o.   MRN: [id number]  HPI Here for follow up of dehydration and fatigue With wife  Feeling better No appetite but eating ---at least better than he was Drinking better Still not much energy--just goes from bed to chair to couch Went out yesterday---drove through drive through himself  Right hip pain is some better Using diclofenac---uses tylenol #3 if bad Thinks he can restart elliptical Walks with the elliptical--but still not stable at times (arms are not that strong)  No chest pain or SOB  Current Outpatient Medications on File Prior to Visit  Medication Sig Dispense Refill   acetaminophen-codeine (TYLENOL #3) 300-30 MG tablet Take 1-2 tablets by mouth at bedtime as needed for moderate pain. 30 tablet 0   amLODipine (NORVASC) 5 MG tablet TAKE 1 TABLET (5 MG TOTAL) BY MOUTH DAILY. 90 tablet 3   clopidogrel (PLAVIX) 75 MG tablet TAKE 1 TABLET BY MOUTH EVERY DAY 90 tablet 2   rosuvastatin (CRESTOR) 20 MG tablet TAKE 1 TABLET BY MOUTH EVERY DAY 90 tablet 3   No current facility-administered medications on file prior to visit.    No Known Allergies  Past Medical History:  Diagnosis Date   Arthritis    Cervical disc disease    COPD (chronic obstructive pulmonary disease) (HCC)    ED (erectile dysfunction)    Hyperlipidemia    PAD (peripheral artery disease) (HCC) 02/05/2013   05/14: Subtotally occluded right external iliac artery, long occlusion of the left proximal to mid SFA with 1 vessel runoff below the knee, diffusely diseased right SFA with short occlusion in the midsegment with three-vessel runoff below the knee. Status post self-expanding stent placement to the right external iliac artery.   Peptic ulcer    Substance abuse [facility])     Past Surgical History:  Procedure Laterality Date   ABDOMINAL AORTAGRAM N/A 02/13/2013   Procedure: ABDOMINAL Ronny Flurry;  Surgeon: Iran Ouch, MD;   Location: [facility];  Service: Cardiovascular;  Laterality: N/A;   CERVICAL DISC SURGERY  1996   HARVEST BONE MARROW     Donor   ILIAC ARTERY STENT     right stent placement   LOWER EXTREMITY ANGIOGRAM  02/13/2013   Procedure: LOWER EXTREMITY ANGIOGRAM;  Surgeon: Iran Ouch, MD;  Location: [facility];  Service: Cardiovascular;;   PERCUTANEOUS STENT INTERVENTION Right 02/13/2013   Procedure: PERCUTANEOUS STENT INTERVENTION;  Surgeon: Iran Ouch, MD;  Location: [facility];  Service: Cardiovascular;  Laterality: Right;  rt ext iliac stent   STOMACH SURGERY  1974   gastric ulcer    Family History  Problem Relation Age of Onset   Clotting disorder Mother        blood clot   Heart disease Father    Arthritis Sister    Leukemia Son        bone marrow transplant   Hypertension Neg Hx    Colon cancer Neg Hx    Stomach cancer Neg Hx     Social History   Socioeconomic History   Marital status: Married    Spouse name: Not on file   Number of children: 7   Years of education: Not on file   Highest education level: Not on file  Occupational History   Occupation: retired Music therapist   Tobacco Use   Smoking status: Every Day    Current packs/day: 0.75  Average packs/day: 0.8 packs/day for 56.9 years (42.7 ttl pk-yrs)    Types: Cigarettes    Start date: 09/12/1966    Passive exposure: Past   Smokeless tobacco: Never   Tobacco comments:    Smokes ~17 cigarettes per day  Substance and Sexual Activity   Alcohol use: No    Comment: rarely   Drug use: Yes    Frequency: 7.0 times per week    Types: Marijuana   Sexual activity: Not on file  Other Topics Concern   Not on file  Social History Narrative   No living will   Requests that wife make medical decisions for him if he is unable.   Alternate would be son Berna Spare   Would accept CPR.    Would accept mechanical ventilation.    Would accept feeding tube   Social Drivers of Health   Financial Resource Strain: Low  Risk  (10/13/2021)   Overall Financial Resource Strain (CARDIA)    Difficulty of Paying Living Expenses: Not hard at all  Food Insecurity: Not on file  Transportation Needs: No Transportation Needs (10/13/2021)   PRAPARE - Administrator, Civil Service (Medical): No    Lack of Transportation (Non-Medical): No  Physical Activity: Not on file  Stress: Not on file  Social Connections: Not on file  Intimate Partner Violence: Not on file   Review of Systems Not sleeping great--sleeps 4 hours, then up again. Does nap on couch a lot     Objective:   Physical Exam Constitutional:      Appearance: Normal appearance.  Musculoskeletal:     Comments: Right hip tenderness has improved ROM is fairly good  Neurological:     Mental Status: He is alert.            Assessment & Plan:

## 2023-11-08 DIAGNOSIS — N2581 Secondary hyperparathyroidism of renal origin: Secondary | ICD-10-CM | POA: Diagnosis not present

## 2023-11-08 DIAGNOSIS — R809 Proteinuria, unspecified: Secondary | ICD-10-CM | POA: Diagnosis not present

## 2023-11-08 DIAGNOSIS — N281 Cyst of kidney, acquired: Secondary | ICD-10-CM | POA: Diagnosis not present

## 2023-11-08 DIAGNOSIS — N1832 Chronic kidney disease, stage 3b: Secondary | ICD-10-CM | POA: Diagnosis not present

## 2023-11-08 DIAGNOSIS — I10 Essential (primary) hypertension: Secondary | ICD-10-CM | POA: Diagnosis not present

## 2023-11-08 DIAGNOSIS — J449 Chronic obstructive pulmonary disease, unspecified: Secondary | ICD-10-CM | POA: Diagnosis not present

## 2023-11-29 ENCOUNTER — Other Ambulatory Visit: Payer: Self-pay | Admitting: Cardiovascular Disease

## 2023-12-15 ENCOUNTER — Other Ambulatory Visit: Payer: Self-pay | Admitting: Internal Medicine

## 2024-01-08 ENCOUNTER — Other Ambulatory Visit: Payer: Self-pay | Admitting: *Deleted

## 2024-01-08 DIAGNOSIS — I739 Peripheral vascular disease, unspecified: Secondary | ICD-10-CM

## 2024-01-11 ENCOUNTER — Ambulatory Visit: Attending: Cardiovascular Disease | Admitting: Cardiovascular Disease

## 2024-01-11 ENCOUNTER — Encounter: Payer: Self-pay | Admitting: Cardiovascular Disease

## 2024-01-11 VITALS — BP 171/77 | HR 60 | Ht 67.0 in | Wt 153.0 lb

## 2024-01-11 DIAGNOSIS — I739 Peripheral vascular disease, unspecified: Secondary | ICD-10-CM | POA: Insufficient documentation

## 2024-01-11 DIAGNOSIS — R0989 Other specified symptoms and signs involving the circulatory and respiratory systems: Secondary | ICD-10-CM | POA: Insufficient documentation

## 2024-01-11 DIAGNOSIS — E785 Hyperlipidemia, unspecified: Secondary | ICD-10-CM | POA: Diagnosis not present

## 2024-01-11 DIAGNOSIS — I10 Essential (primary) hypertension: Secondary | ICD-10-CM | POA: Insufficient documentation

## 2024-01-11 DIAGNOSIS — Z720 Tobacco use: Secondary | ICD-10-CM | POA: Diagnosis not present

## 2024-01-11 DIAGNOSIS — I2510 Atherosclerotic heart disease of native coronary artery without angina pectoris: Secondary | ICD-10-CM | POA: Diagnosis not present

## 2024-01-11 MED ORDER — LOSARTAN POTASSIUM 50 MG PO TABS: 50.0000 mg | ORAL_TABLET | Freq: Every day | ORAL | 3 refills | Status: AC

## 2024-01-11 NOTE — Progress Notes (Signed)
 Cardiology Office Note   Date:  01/11/2024   ID:  Cregg, Tagliaferri [birth year], MRN [id number]  PCP:  Helaine Llanos, MD  Cardiologist:   Antionette Kirks, MD   No chief complaint on file.     History of Present Illness: Sean [name] is a 83 y.o. male who presents for a followup visit regarding peripheral arterial disease and slightly abnormal stress test..  He has prolonged history of tobacco , hypertension, chronic kidney disease and hyperlipidemia.  He is status post right external iliac artery stent placement in May 2014 for severe claudication. He is known to have long bilateral SFA occlusions being  managed medically. Nuclear stress test in 2014 showed evidence of a small inferior infarct without significant ischemia. Ejection fraction was 52%.  He has been doing reasonably well and denies chest pain.  He reports stable exertional dyspnea.  He does report mild bilateral leg weakness.  He is no longer on hydrochlorothiazide  likely due to chronic kidney disease.  His blood pressure is noted to be elevated today.  Past Medical History:  Diagnosis Date   Arthritis    Cervical disc disease    COPD (chronic obstructive pulmonary disease) (HCC)    ED (erectile dysfunction)    Hyperlipidemia    PAD (peripheral artery disease) (HCC) 02/05/2013   05/14: Subtotally occluded right external iliac artery, long occlusion of the left proximal to mid SFA with 1 vessel runoff below the knee, diffusely diseased right SFA with short occlusion in the midsegment with three-vessel runoff below the knee. Status post self-expanding stent placement to the right external iliac artery.   Peptic ulcer    Substance abuse [facility])     Past Surgical History:  Procedure Laterality Date   ABDOMINAL AORTAGRAM N/A 02/13/2013   Procedure: ABDOMINAL Tommi Fraise;  Surgeon: Wenona Hamilton, MD;  Location: [facility] C[facility];  Service: Cardiovascular;  Laterality: N/A;   CERVICAL DISC SURGERY  1996   HARVEST BONE  MARROW     Donor   ILIAC ARTERY STENT     right stent placement   LOWER EXTREMITY ANGIOGRAM  02/13/2013   Procedure: LOWER EXTREMITY ANGIOGRAM;  Surgeon: Wenona Hamilton, MD;  Location: [facility] C[facility];  Service: Cardiovascular;;   PERCUTANEOUS STENT INTERVENTION Right 02/13/2013   Procedure: PERCUTANEOUS STENT INTERVENTION;  Surgeon: Wenona Hamilton, MD;  Location: [facility];  Service: Cardiovascular;  Laterality: Right;  rt ext iliac stent   STOMACH SURGERY  1974   gastric ulcer     Current Outpatient Medications  Medication Sig Dispense Refill   acetaminophen -codeine  (TYLENOL  #3) 300-30 MG tablet Take 1-2 tablets by mouth at bedtime as needed for moderate pain. 30 tablet 0   amLODipine  (NORVASC ) 5 MG tablet TAKE 1 TABLET (5 MG TOTAL) BY MOUTH DAILY. 90 tablet 0   clopidogrel  (PLAVIX ) 75 MG tablet TAKE 1 TABLET BY MOUTH EVERY DAY 90 tablet 2   rosuvastatin  (CRESTOR ) 20 MG tablet TAKE 1 TABLET BY MOUTH EVERY DAY 90 tablet 1   Cholecalciferol 125 MCG (5000 UT) capsule Take 5,000 Units by mouth.     No current facility-administered medications for this visit.    Allergies:   Patient has no known allergies.    Social History:  The patient  reports that he has been smoking cigarettes. He started smoking about 57 years ago. He has a 43 pack-year smoking history. He has been exposed to tobacco smoke. He has never used smokeless tobacco. He reports current  drug use. Frequency: 7.00 times per week. Drug: Marijuana. He reports that he does not drink alcohol.   Family History:  The patient's family history includes Arthritis in his sister; Clotting disorder in his mother; Heart disease in his father; Leukemia in his son.    ROS:  Please see the history of present illness.   Otherwise, review of systems are positive for none.   All other systems are reviewed and negative.    PHYSICAL EXAM: VS:  BP (!) 171/77 (BP Location: Left Arm, Patient Position: Sitting, Cuff Size: Normal)   Pulse 60   Ht  5\' 7"  (1.702 m)   Wt 153 lb (69.4 kg)   SpO2 95%   BMI 23.96 kg/m  , BMI Body mass index is 23.96 kg/m. GEN: Well nourished, well developed, in no acute distress  HEENT: normal  Neck: no JVD or masses.  Faint right carotid bruit Cardiac: RRR; no murmurs, rubs, or gallops,no edema  Respiratory:  clear to auscultation bilaterally, normal work of breathing GI: soft, nontender, nondistended, + BS MS: no deformity or atrophy  Skin: warm and dry, no rash Neuro:  Strength and sensation are intact Psych: euthymic mood, full affect   EKG:  EKG is ordered today. The ekg ordered today demonstrates : Sinus rhythm with Premature atrial complexes Low voltage QRS Left anterior fascicular block Inferior infarct , age undetermined       Recent Labs: 08/18/2023: ALT 12; BUN 59; Creatinine, Ser 1.92; Hemoglobin 15.3; Platelets 376.0; Potassium 4.5; Sodium 140    Lipid Panel    Component Value Date/Time   CHOL 128 01/29/2021 1223   CHOL 156 06/23/2014 1129   TRIG 121.0 01/29/2021 1223   TRIG 134 06/23/2014 1129   HDL 32.50 (L) 01/29/2021 1223   HDL 37 (L) 06/23/2014 1129   CHOLHDL 4 01/29/2021 1223   VLDL 24.2 01/29/2021 1223   VLDL 27 06/23/2014 1129   LDLCALC 72 01/29/2021 1223   LDLCALC 92 06/23/2014 1129   LDLDIRECT 181.8 01/16/2013 1027      Wt Readings from Last 3 Encounters:  01/11/24 153 lb (69.4 kg)  08/24/23 141 lb (64 kg)  02/07/23 154 lb (69.9 kg)       ASSESSMENT AND PLAN:  1.  Peripheral arterial disease: Status post stenting of the right external iliac artery with known bilateral SFA occlusion which is being managed medically.  He has mild not lifestyle limiting claudication.  Continue medical therapy.  Repeat ABI and aortoiliac duplex later this month. He is on long-term treatment with clopidogrel .  2. Presumed coronary artery disease based on abnormal stress test in 2014 which showed prior small inferior infarct. Currently with no anginal symptoms. Continue  medical therapy.  3. Hyperlipidemia: Continue treatment with rosuvastatin  20 mg once daily with a target LDL of less than 70.    4. Tobacco use: He reports inability to quit smoking.  5. Essential hypertension: His blood pressure is elevated likely after hydrochlorothiazide  was discontinued.  Continue amlodipine .  Beta-blockers are not a good option due to baseline bradycardia.  I elected to add losartan  50 mg once daily and will check basic metabolic profile in 1 week.  6.  Right carotid bruit: Previous carotid Doppler showed no significant disease.    Disposition:   FU with me in 1 year  Signed,  Antionette Kirks, MD  01/11/2024 10:24 AM    [facility]

## 2024-01-11 NOTE — Patient Instructions (Addendum)
 Medication Instructions:  START Losartan  50 mg once daily  *If you need a refill on your cardiac medications before your next appointment, please call your pharmacy*  Lab Work: Your provider would like for you to return in one week to have the following labs drawn: BMET.   Please go to [facility] [address] ([facility] [address], [place] [address] You do not need an appointment.  They are open from 8 am- 4:30 pm.  Lunch from 1:00 pm- 2:00 pm You will not need to be fasting.   You may also go to one of the following [facility]:  [address] [place], [place] [address] Phone: [phone] Lab hours: Mon-Fri 8 am- 5 pm    Lunch 12 pm- 1 pm  [address] [place],  [place]  [address]  [place]  Phone: [phone] Lab hours: 7 am- 4 pm Lunch 12 pm-1 pm   [address] [place],  [place]  [address]  [place]  Phone: [phone] Lab hours: Mon-Fri 8 am- 5 pm    Lunch 12 pm- 1 pm  If you have labs (blood work) drawn today and your tests are completely normal, you will receive your results only by: MyChart Message (if you have MyChart) OR A paper copy in the mail If you have any lab test that is abnormal or we need to change your treatment, we will call you to review the results.  Testing/Procedures: None ordered  Follow-Up: At [facility], you and your health needs are our priority.  As part of our continuing mission to provide you with exceptional heart care, our providers are all part of one team.  This team includes your primary Cardiologist (physician) and Advanced Practice Providers or APPs (Physician Assistants and Nurse Practitioners) who all work together to provide you with the care you need, when you need it.  Your next appointment:   12 month(s)  Provider:   You may see Antionette Kirks, MD or one of the following Advanced Practice Providers on your designated Care Team:   Laneta Pintos, NP Gildardo Labrador, PA-C Varney Gentleman, PA-C Cadence Iaeger,  PA-C Ronald Cockayne, NP Morey Ar, NP    We recommend signing up for the patient portal called "MyChart".  Sign up information is provided on this After Visit Summary.  MyChart is used to connect with patients for Virtual Visits (Telemedicine).  Patients are able to view lab/test results, encounter notes, upcoming appointments, etc.  Non-urgent messages can be sent to your provider as well.   To learn more about what you can do with MyChart, go to [email or web address].

## 2024-01-29 ENCOUNTER — Ambulatory Visit

## 2024-02-04 ENCOUNTER — Other Ambulatory Visit: Payer: Self-pay | Admitting: Physician Assistant

## 2024-02-09 ENCOUNTER — Ambulatory Visit: Payer: Medicare Other | Admitting: Internal Medicine

## 2024-02-09 ENCOUNTER — Encounter: Payer: Self-pay | Admitting: Internal Medicine

## 2024-02-09 VITALS — BP 104/74 | HR 76 | Temp 98.1°F | Ht 67.0 in | Wt 158.0 lb

## 2024-02-09 DIAGNOSIS — I2510 Atherosclerotic heart disease of native coronary artery without angina pectoris: Secondary | ICD-10-CM | POA: Diagnosis not present

## 2024-02-09 DIAGNOSIS — N1832 Chronic kidney disease, stage 3b: Secondary | ICD-10-CM | POA: Diagnosis not present

## 2024-02-09 DIAGNOSIS — Z0000 Encounter for general adult medical examination without abnormal findings: Secondary | ICD-10-CM

## 2024-02-09 DIAGNOSIS — I739 Peripheral vascular disease, unspecified: Secondary | ICD-10-CM

## 2024-02-09 DIAGNOSIS — N2581 Secondary hyperparathyroidism of renal origin: Secondary | ICD-10-CM

## 2024-02-09 DIAGNOSIS — J449 Chronic obstructive pulmonary disease, unspecified: Secondary | ICD-10-CM | POA: Diagnosis not present

## 2024-02-09 NOTE — Assessment & Plan Note (Signed)
 No claudication since procedure Statin and plavix 

## 2024-02-09 NOTE — Progress Notes (Signed)
 Vision Screening   Right eye Left eye Both eyes  Without correction 20/70 20/25 20/25   With correction     Hearing Screening - Comments:: Passed whisper test

## 2024-02-09 NOTE — Assessment & Plan Note (Signed)
 PTH normalized on vitamin D

## 2024-02-09 NOTE — Progress Notes (Signed)
 Subjective:    Patient ID: Sean [name], male    DOB: [birth year], 83 y.o.   MRN: [id number]  HPI Here for Medicare wellness visit and follow up of chronic health conditions Reviewed advanced directives Reviewed other doctors---Dr Arida--cardiology, Dr Buckner Carder No hospitalizations or surgery in the past year Still smoking---not ready to stop Rare alcohol Vision is not great---but no functional problems. Urged him to get checked Hearing is okay Does some exercise--elliptical mainly and does stretching No falls No depression or anhedonia Independent with instrumental ADLs No functional memory issues---just some recall issues  No claudication No chest pain or SOB No dizziness or syncope No edema No headaches No palitations  Reviewed labs---GFR in 30's for 3 years Last done at nephrologist and up to 48 PTH elevated last year --but now back to normal Has improved his eating Weight up some--which is good  No sig joint pains Hip is better now Not using tylenol  #3  No regular cough No wheezing  Current Outpatient Medications on File Prior to Visit  Medication Sig Dispense Refill   amLODipine  (NORVASC ) 5 MG tablet TAKE 1 TABLET (5 MG TOTAL) BY MOUTH DAILY. 90 tablet 0   Cholecalciferol 125 MCG (5000 UT) capsule Take 5,000 Units by mouth.     clopidogrel  (PLAVIX ) 75 MG tablet TAKE 1 TABLET BY MOUTH EVERY DAY 90 tablet 3   losartan  (COZAAR ) 50 MG tablet Take 1 tablet (50 mg total) by mouth daily. 90 tablet 3   rosuvastatin  (CRESTOR ) 20 MG tablet TAKE 1 TABLET BY MOUTH EVERY DAY 90 tablet 1   No current facility-administered medications on file prior to visit.    No Known Allergies  Past Medical History:  Diagnosis Date   Arthritis    Cervical disc disease    COPD (chronic obstructive pulmonary disease) (HCC)    ED (erectile dysfunction)    Hyperlipidemia    PAD (peripheral artery disease) (HCC) 02/05/2013   05/14: Subtotally occluded right external  iliac artery, long occlusion of the left proximal to mid SFA with 1 vessel runoff below the knee, diffusely diseased right SFA with short occlusion in the midsegment with three-vessel runoff below the knee. Status post self-expanding stent placement to the right external iliac artery.   Peptic ulcer    Substance abuse [facility])     Past Surgical History:  Procedure Laterality Date   ABDOMINAL AORTAGRAM N/A 02/13/2013   Procedure: ABDOMINAL Tommi Fraise;  Surgeon: Wenona Hamilton, MD;  [facility]: [facility];  Service: Cardiovascular;  Laterality: N/A;   CERVICAL DISC SURGERY  1996   HARVEST BONE MARROW     Donor   ILIAC ARTERY STENT     right stent placement   LOWER EXTREMITY ANGIOGRAM  02/13/2013   Procedure: LOWER EXTREMITY ANGIOGRAM;  Surgeon: Wenona Hamilton, MD;  [facility]: [facility];  Service: Cardiovascular;;   PERCUTANEOUS STENT INTERVENTION Right 02/13/2013   Procedure: PERCUTANEOUS STENT INTERVENTION;  Surgeon: Wenona Hamilton, MD;  [facility]: [facility];  Service: Cardiovascular;  Laterality: Right;  rt ext iliac stent   STOMACH SURGERY  1974   gastric ulcer    Family History  Problem Relation Age of Onset   Clotting disorder Mother        blood clot   Heart disease Father    Arthritis Sister    Leukemia Son        bone marrow transplant   Hypertension Neg Hx    Colon cancer Neg Hx  Stomach cancer Neg Hx     Social History   Socioeconomic History   Marital status: Married    Spouse name: Not on file   Number of children: 7   Years of education: Not on file   Highest education level: Not on file  Occupational History   Occupation: retired Music therapist   Tobacco Use   Smoking status: Every Day    Current packs/day: 0.75    Average packs/day: 0.8 packs/day for 57.4 years (43.1 ttl pk-yrs)    Types: Cigarettes    Start date: 09/12/1966    Passive exposure: Past   Smokeless tobacco: Never   Tobacco comments:    Smokes ~17 cigarettes per day  Substance and Sexual  Activity   Alcohol use: No    Comment: rarely   Drug use: Yes    Frequency: 7.0 times per week    Types: Marijuana   Sexual activity: Not on file  Other Topics Concern   Not on file  Social History Narrative   No living will   Requests that wife make medical decisions for him if he is unable.   Alternate would be son Autry Legions   Would accept CPR.    Would accept mechanical ventilation.    Would accept feeding tube   Social Drivers of Health   Financial Resource Strain: Low Risk  (10/13/2021)   Overall Financial Resource Strain (CARDIA)    Difficulty of Paying Living Expenses: Not hard at all  Food Insecurity: Not on file  Transportation Needs: No Transportation Needs (10/13/2021)   PRAPARE - Administrator, Civil Service (Medical): No    Lack of Transportation (Non-Medical): No  Physical Activity: Not on file  Stress: Not on file  Social Connections: Not on file  Intimate Partner Violence: Not on file   Review of Systems Appetite is good Sleeps well Wears seat belt Full dentures--no problems No heartburn or dysphagia Bowels move fine--no blood No voiding problems---just frequency (drinks a lot of water). Nocturia x 1. Stream is okay No suspicious skin lesions    Objective:   Physical Exam Constitutional:      Appearance: Normal appearance.  HENT:     Mouth/Throat:     Pharynx: No oropharyngeal exudate or posterior oropharyngeal erythema.  Eyes:     Conjunctiva/sclera: Conjunctivae normal.     Pupils: Pupils are equal, round, and reactive to light.  Cardiovascular:     Rate and Rhythm: Normal rate and regular rhythm.     Heart sounds: No murmur heard.    No gallop.     Comments: Feet warm without palpable pulses Pulmonary:     Effort: Pulmonary effort is normal.     Breath sounds: No wheezing or rales.     Comments: Decreased breath sounds but clear Abdominal:     Palpations: Abdomen is soft.     Tenderness: There is no abdominal tenderness.   Musculoskeletal:     Cervical back: Neck supple.     Right lower leg: No edema.     Left lower leg: No edema.  Lymphadenopathy:     Cervical: No cervical adenopathy.  Skin:    Findings: No lesion or rash.  Neurological:     General: No focal deficit present.     Mental Status: He is alert and oriented to person, place, and time.     Comments: Mini-cog---normal  Psychiatric:        Mood and Affect: Mood normal.  Behavior: Behavior normal.            Assessment & Plan:

## 2024-02-09 NOTE — Assessment & Plan Note (Signed)
 No angina Plavix  and losartan  100 Crestor  20 daily

## 2024-02-09 NOTE — Assessment & Plan Note (Signed)
 No sig symptoms but urged him to at least cut back on cigarettes

## 2024-02-09 NOTE — Assessment & Plan Note (Signed)
 Last GFR actually better--?from improved diet On losartan  50mg  daily

## 2024-02-09 NOTE — Assessment & Plan Note (Signed)
 I have personally reviewed the Medicare Annual Wellness questionnaire and have noted 1. The patient's medical and social history 2. Their use of alcohol, tobacco or illicit drugs 3. Their current medications and supplements 4. The patient's functional ability including ADL's, fall risks, home safety risks and hearing or visual             impairment. 5. Diet and physical activities 6. Evidence for depression or mood disorders  The patients weight, height, BMI and visual acuity have been recorded in the chart I have made referrals, counseling and provided education to the patient based review of the above and I have provided the pt with a written personalized care plan for preventive services.  I have provided you with a copy of your personalized plan for preventive services. Please take the time to review along with your updated medication list.  Still refers no vaccines---Td if any injury No cancer screening due to age Does exercise some

## 2024-02-15 ENCOUNTER — Ambulatory Visit (HOSPITAL_COMMUNITY)
Admission: RE | Admit: 2024-02-15 | Discharge: 2024-02-15 | Disposition: A | Discharge status: Home / Self Care | Source: Ambulatory Visit | Attending: Cardiovascular Disease | Admitting: Cardiovascular Disease

## 2024-02-15 DIAGNOSIS — Z95828 Presence of other vascular implants and grafts: Secondary | ICD-10-CM

## 2024-02-15 DIAGNOSIS — I739 Peripheral vascular disease, unspecified: Secondary | ICD-10-CM | POA: Diagnosis not present

## 2024-02-17 LAB — VAS US ABI WITH/WO TBI
Left ABI: 0.7
Right ABI: 0.73

## 2024-02-22 ENCOUNTER — Ambulatory Visit: Payer: Self-pay | Admitting: Cardiovascular Disease

## 2024-02-22 DIAGNOSIS — I739 Peripheral vascular disease, unspecified: Secondary | ICD-10-CM

## 2024-03-12 ENCOUNTER — Other Ambulatory Visit: Payer: Self-pay | Admitting: Internal Medicine

## 2024-04-23 DIAGNOSIS — R809 Proteinuria, unspecified: Secondary | ICD-10-CM | POA: Diagnosis not present

## 2024-04-23 DIAGNOSIS — N2581 Secondary hyperparathyroidism of renal origin: Secondary | ICD-10-CM | POA: Diagnosis not present

## 2024-04-23 DIAGNOSIS — I10 Essential (primary) hypertension: Secondary | ICD-10-CM | POA: Diagnosis not present

## 2024-04-23 DIAGNOSIS — R808 Other proteinuria: Secondary | ICD-10-CM | POA: Diagnosis not present

## 2024-04-23 DIAGNOSIS — N281 Cyst of kidney, acquired: Secondary | ICD-10-CM | POA: Diagnosis not present

## 2024-04-23 DIAGNOSIS — E785 Hyperlipidemia, unspecified: Secondary | ICD-10-CM | POA: Diagnosis not present

## 2024-04-23 DIAGNOSIS — J449 Chronic obstructive pulmonary disease, unspecified: Secondary | ICD-10-CM | POA: Diagnosis not present

## 2024-04-23 DIAGNOSIS — N1832 Chronic kidney disease, stage 3b: Secondary | ICD-10-CM | POA: Diagnosis not present

## 2024-05-01 DIAGNOSIS — N281 Cyst of kidney, acquired: Secondary | ICD-10-CM | POA: Diagnosis not present

## 2024-05-01 DIAGNOSIS — R809 Proteinuria, unspecified: Secondary | ICD-10-CM | POA: Diagnosis not present

## 2024-05-01 DIAGNOSIS — N1832 Chronic kidney disease, stage 3b: Secondary | ICD-10-CM | POA: Diagnosis not present

## 2024-05-01 DIAGNOSIS — N2581 Secondary hyperparathyroidism of renal origin: Secondary | ICD-10-CM | POA: Diagnosis not present

## 2024-05-01 DIAGNOSIS — I10 Essential (primary) hypertension: Secondary | ICD-10-CM | POA: Diagnosis not present

## 2024-05-01 DIAGNOSIS — E785 Hyperlipidemia, unspecified: Secondary | ICD-10-CM | POA: Diagnosis not present

## 2024-05-01 DIAGNOSIS — J449 Chronic obstructive pulmonary disease, unspecified: Secondary | ICD-10-CM | POA: Diagnosis not present

## 2024-05-29 ENCOUNTER — Other Ambulatory Visit: Payer: Self-pay | Admitting: Cardiovascular Disease

## 2024-06-10 ENCOUNTER — Other Ambulatory Visit: Payer: Self-pay

## 2024-06-10 MED ORDER — AMLODIPINE BESYLATE 5 MG PO TABS: 5.0000 mg | ORAL_TABLET | Freq: Every day | ORAL | 2 refills | Status: AC

## 2024-06-10 NOTE — Telephone Encounter (Signed)
 Rx sent electronically.

## 2025-02-10 ENCOUNTER — Encounter: Admitting: Nurse Practitioner
